# Patient Record
Sex: Female | Born: 1960 | Race: Black or African American | Hispanic: No | Smoking: Never smoker
Health system: Southern US, Community
[De-identification: ages and names within clinical notes are randomized; demographics above are authoritative.]

## PROBLEM LIST (undated history)

## (undated) DIAGNOSIS — M797 Fibromyalgia: Secondary | ICD-10-CM

## (undated) DIAGNOSIS — G40909 Epilepsy, unspecified, not intractable, without status epilepticus: Secondary | ICD-10-CM

## (undated) DIAGNOSIS — E7800 Pure hypercholesterolemia, unspecified: Secondary | ICD-10-CM

## (undated) DIAGNOSIS — J45909 Unspecified asthma, uncomplicated: Secondary | ICD-10-CM

## (undated) DIAGNOSIS — I10 Essential (primary) hypertension: Secondary | ICD-10-CM

## (undated) DIAGNOSIS — I4891 Unspecified atrial fibrillation: Secondary | ICD-10-CM

## (undated) HISTORY — PX: CHOLECYSTECTOMY: SHX55

---

## 2021-02-10 ENCOUNTER — Other Ambulatory Visit: Payer: Self-pay

## 2021-02-10 ENCOUNTER — Emergency Department (HOSPITAL_BASED_OUTPATIENT_CLINIC_OR_DEPARTMENT_OTHER): Payer: Medicare Other

## 2021-02-10 ENCOUNTER — Emergency Department (HOSPITAL_BASED_OUTPATIENT_CLINIC_OR_DEPARTMENT_OTHER)
Admission: EM | Admit: 2021-02-10 | Discharge: 2021-02-11 | Disposition: A | Discharge status: Home / Self Care | Payer: Medicare Other | Attending: Emergency Medicine | Admitting: Emergency Medicine

## 2021-02-10 ENCOUNTER — Encounter (HOSPITAL_BASED_OUTPATIENT_CLINIC_OR_DEPARTMENT_OTHER): Payer: Self-pay | Admitting: *Deleted

## 2021-02-10 DIAGNOSIS — I482 Chronic atrial fibrillation, unspecified: Secondary | ICD-10-CM | POA: Diagnosis not present

## 2021-02-10 DIAGNOSIS — M79671 Pain in right foot: Secondary | ICD-10-CM | POA: Insufficient documentation

## 2021-02-10 DIAGNOSIS — Z7901 Long term (current) use of anticoagulants: Secondary | ICD-10-CM | POA: Insufficient documentation

## 2021-02-10 DIAGNOSIS — I10 Essential (primary) hypertension: Secondary | ICD-10-CM | POA: Diagnosis not present

## 2021-02-10 DIAGNOSIS — R072 Precordial pain: Secondary | ICD-10-CM | POA: Insufficient documentation

## 2021-02-10 DIAGNOSIS — M25571 Pain in right ankle and joints of right foot: Secondary | ICD-10-CM | POA: Insufficient documentation

## 2021-02-10 DIAGNOSIS — Z79899 Other long term (current) drug therapy: Secondary | ICD-10-CM | POA: Diagnosis not present

## 2021-02-10 DIAGNOSIS — J45909 Unspecified asthma, uncomplicated: Secondary | ICD-10-CM | POA: Insufficient documentation

## 2021-02-10 HISTORY — DX: Pure hypercholesterolemia, unspecified: E78.00

## 2021-02-10 HISTORY — DX: Essential (primary) hypertension: I10

## 2021-02-10 HISTORY — DX: Epilepsy, unspecified, not intractable, without status epilepticus: G40.909

## 2021-02-10 HISTORY — DX: Unspecified atrial fibrillation: I48.91

## 2021-02-10 HISTORY — DX: Fibromyalgia: M79.7

## 2021-02-10 HISTORY — DX: Unspecified asthma, uncomplicated: J45.909

## 2021-02-10 NOTE — ED Triage Notes (Signed)
Pt. Said the [place] was not done on Sat.  It was done in Feb. 2022 she was confused on the dates.

## 2021-02-10 NOTE — ED Triage Notes (Signed)
Pt. Reports she has Fibromyalgia and was Diagnosed on Sat.  Pt. Said she has pain from the groin into her R ankle.  Pt. Said the pain was so bad in her R ankle and lower R leg that her chest began to hurt.  Pt. Able to verbalize everything with no shortness of breath and reports the ankle being the main source of pain tonight. We will do EKG in triage due to CP and history of A FIB.

## 2021-02-11 ENCOUNTER — Encounter (HOSPITAL_BASED_OUTPATIENT_CLINIC_OR_DEPARTMENT_OTHER): Payer: Self-pay | Admitting: Emergency Medicine

## 2021-02-11 ENCOUNTER — Emergency Department (HOSPITAL_BASED_OUTPATIENT_CLINIC_OR_DEPARTMENT_OTHER): Payer: Medicare Other

## 2021-02-11 DIAGNOSIS — R072 Precordial pain: Secondary | ICD-10-CM | POA: Diagnosis not present

## 2021-02-11 LAB — BASIC METABOLIC PANEL
Anion gap: 11 (ref 5–15)
BUN: 10 mg/dL (ref 8–23)
CO2: 24 mmol/L (ref 22–32)
Calcium: 8.6 mg/dL — ABNORMAL LOW (ref 8.9–10.3)
Chloride: 94 mmol/L — ABNORMAL LOW (ref 98–111)
Creatinine, Ser: 0.61 mg/dL (ref 0.44–1.00)
GFR, Estimated: >60 mL/min (ref >60)
Glucose, Bld: 131 mg/dL — ABNORMAL HIGH (ref 70–99)
Potassium: 3.3 mmol/L — ABNORMAL LOW (ref 3.5–5.1)
Sodium: 129 mmol/L — ABNORMAL LOW (ref 135–145)

## 2021-02-11 LAB — CBC WITH DIFFERENTIAL/PLATELET
Abs Immature Granulocytes: 0.01 10*3/uL (ref 0.00–0.07)
Basophils Absolute: 0.1 10*3/uL (ref 0.0–0.1)
Basophils Relative: 1 %
Eosinophils Absolute: 0 10*3/uL (ref 0.0–0.5)
Eosinophils Relative: 0 %
HCT: 44.2 % (ref 36.0–46.0)
Hemoglobin: 15.6 g/dL — ABNORMAL HIGH (ref 12.0–15.0)
Immature Granulocytes: 0 %
Lymphocytes Relative: 48 %
Lymphs Abs: 4 10*3/uL (ref 0.7–4.0)
MCH: 30.5 pg (ref 26.0–34.0)
MCHC: 35.3 g/dL (ref 30.0–36.0)
MCV: 86.3 fL (ref 80.0–100.0)
Monocytes Absolute: 0.5 10*3/uL (ref 0.1–1.0)
Monocytes Relative: 5 %
Neutro Abs: 3.9 10*3/uL (ref 1.7–7.7)
Neutrophils Relative %: 46 %
Platelets: 297 10*3/uL (ref 150–400)
RBC: 5.12 MIL/uL — ABNORMAL HIGH (ref 3.87–5.11)
RDW: 12.8 % (ref 11.5–15.5)
Smear Review: NORMAL
WBC Morphology: ABNORMAL
WBC: 8.5 10*3/uL (ref 4.0–10.5)
nRBC: 0 % (ref 0.0–0.2)

## 2021-02-11 LAB — TROPONIN I (HIGH SENSITIVITY)
Troponin I (High Sensitivity): 2 ng/L (ref <18)
Troponin I (High Sensitivity): <2 ng/L (ref <18)

## 2021-02-11 MED ORDER — ALUM & MAG HYDROXIDE-SIMETH 200-200-20 MG/5ML PO SUSP
30.0000 mL | Freq: Once | ORAL | Status: AC
  Administered 2021-02-11: 30 mL via ORAL

## 2021-02-11 MED ORDER — LIDOCAINE 5 % EX PTCH
1.0000 | MEDICATED_PATCH | CUTANEOUS | 0 refills | Status: DC
Start: 2021-02-11 — End: 2023-01-11

## 2021-02-11 MED ORDER — IOHEXOL 350 MG/ML SOLN
100.0000 mL | Freq: Once | INTRAVENOUS | Status: AC | PRN
  Administered 2021-02-11: 100 mL via INTRAVENOUS

## 2021-02-11 MED ORDER — ALUM & MAG HYDROXIDE-SIMETH 200-200-20 MG/5ML PO SUSP
ORAL | Status: AC
  Filled 2021-02-11: qty 30

## 2021-02-11 MED ORDER — LIDOCAINE 5 % EX PTCH
3.0000 | MEDICATED_PATCH | CUTANEOUS | Status: DC
  Administered 2021-02-11: 3 via TRANSDERMAL

## 2021-02-11 MED ORDER — ACETAMINOPHEN 500 MG PO TABS: 1000.0000 mg | ORAL_TABLET | Freq: Once | ORAL | Status: DC

## 2021-02-11 MED ORDER — ACETAMINOPHEN 500 MG PO TABS
ORAL_TABLET | ORAL | Status: AC
  Filled 2021-02-11: qty 2

## 2021-02-11 NOTE — ED Provider Notes (Signed)
[facility]PART[facility] Provider Note   CSN: [id number] Arrival date & time: 02/10/21  2201     History Chief Complaint  Patient presents with  . Ankle Pain  . Chest Pain    Kathleen Moreno is a 60 y.o. female.  The history is provided by the patient.  Ankle Pain Location:  Ankle and foot Injury: no   Ankle location:  R ankle Foot location:  R foot Pain details:    Quality:  Aching   Radiates to:  Does not radiate   Severity:  Moderate   Onset quality:  Gradual   Timing:  Constant   Progression:  Unchanged Chronicity:  Recurrent Relieved by:  Nothing Worsened by:  Nothing Ineffective treatments:  None tried Associated symptoms: no back pain, no decreased ROM, no fatigue, no fever, no muscle weakness, no neck pain, no numbness, no stiffness, no swelling and no tingling   Risk factors: no concern for non-accidental trauma   Chest Pain Pain location:  L chest Pain quality: dull   Pain radiates to:  Does not radiate Pain severity:  Mild Onset quality:  Gradual Timing:  Constant Progression:  Unchanged Chronicity:  New Context: at rest   Relieved by:  Nothing Worsened by:  Nothing Ineffective treatments:  None tried Associated symptoms: no abdominal pain, no altered mental status, no back pain, no cough, no diaphoresis, no fatigue, no fever, no nausea, no orthopnea, no palpitations, no shortness of breath, no vomiting and no weakness   Risk factors: no birth control   Patient with chronic AFIB on Xarelto and fibromyalgia present with right ankle and foot pain for 4 hours. No swelling, no trauma.  No missed doses of anti-coagulation.  Patient also has chest pain for several hours that started at rest.  No DOE, no SOB.  No n/v/d.  No f/c/r.       Past Medical History:  Diagnosis Date  . Asthma   . Atrial fibrillation ([place])   . Fibromyalgia   . Hypercholesteremia   . Hypertension   . Seizure disorder ([place])     There are no problems to display  for this patient.   Past Surgical History:  Procedure Laterality Date  . CESAREAN SECTION     times 3  . CHOLECYSTECTOMY       OB History   No obstetric history on file.     History reviewed. No pertinent family history.     Home Medications Prior to Admission medications   Medication Sig Start Date End Date Taking? Authorizing Provider  albuterol (VENTOLIN HFA) 108 (90 Base) MCG/ACT inhaler INHALE 2 PUFFS BY MOUTH EVERY 6 HOURS AS NEEDED FOR WHEEZING 02/01/20  Yes [provider]  Blood Pressure Monitoring (BLOOD PRESSURE KIT) KIT 1 Device by Misc.(Non-Drug; Combo Route) route 2 (two) times daily as needed (for monitoring of Hypertenison). 03/29/19  Yes [provider]  budesonide-formoterol (SYMBICORT) 160-4.5 MCG/ACT inhaler 2 puffs bid rinsing mouth out after each use 02/01/20  Yes [provider]  Cholecalciferol 25 MCG (1000 UT) tablet Take by mouth.   Yes [provider]  cyanocobalamin 1000 MCG tablet Take by mouth.   Yes [provider]  lidocaine (LIDODERM) 5 % Place 1 patch onto the skin daily. Remove & Discard patch within 12 hours or as directed by MD 02/11/21  Yes Jamelia Varano, MD  LORazepam (ATIVAN) 0.5 MG tablet Take 1 tablet by mouth daily as needed. 10/11/19  Yes [provider]  losartan-hydrochlorothiazide (  HYZAAR) 50-12.5 MG tablet  12/06/14  Yes [provider]  oxcarbazepine (TRILEPTAL) 600 MG tablet Take 1 tablet by mouth 2 (two) times daily. 08/06/20  Yes [provider]  potassium chloride (MICRO-K) 10 MEQ CR capsule TAKE 1 CAPSULE BY MOUTH TWICE DAILY WITH FOOD 06/27/20  Yes [provider]  rivaroxaban (XARELTO) 20 MG TABS tablet Take 20 mg by mouth daily with supper.   Yes [provider]  simvastatin (ZOCOR) 40 MG tablet Take 1 tablet by mouth at bedtime. 08/29/20  Yes [provider]    Allergies    Codeine  Review of Systems   Review of Systems   Constitutional: Negative for diaphoresis, fatigue and fever.  HENT: Negative for congestion.   Eyes: Negative for visual disturbance.  Respiratory: Negative for cough and shortness of breath.   Cardiovascular: Positive for chest pain. Negative for palpitations and orthopnea.  Gastrointestinal: Negative for abdominal pain, nausea and vomiting.  Genitourinary: Negative for difficulty urinating.  Musculoskeletal: Negative for back pain, neck pain and stiffness.  Skin: Negative for rash.  Neurological: Negative for weakness.  Psychiatric/Behavioral: Negative for agitation.  All other systems reviewed and are negative.   Physical Exam Updated Vital Signs BP 120/85 (BP Location: Right Arm)   Pulse 72   Temp 98.2 F (36.8 C) (Oral)   Resp 19   Ht 4' 11"  (1.499 m)   Wt 76.7 kg   SpO2 98%   BMI 34.13 kg/m   Physical Exam Vitals and nursing note reviewed.  Constitutional:      General: She is not in acute distress.    Appearance: Normal appearance.  HENT:     Head: Normocephalic and atraumatic.     Nose: Nose normal.  Eyes:     Extraocular Movements: Extraocular movements intact.     Conjunctiva/sclera: Conjunctivae normal.     Pupils: Pupils are equal, round, and reactive to light.  Cardiovascular:     Rate and Rhythm: Normal rate and regular rhythm.     Pulses: Normal pulses.     Heart sounds: Normal heart sounds.  Pulmonary:     Effort: Pulmonary effort is normal.     Breath sounds: Normal breath sounds.  Abdominal:     General: Abdomen is flat. Bowel sounds are normal.     Palpations: Abdomen is soft.     Tenderness: There is no abdominal tenderness. There is no guarding.  Musculoskeletal:        General: Normal range of motion.     Cervical back: Normal range of motion and neck supple.     Right upper leg: Normal.     Right knee: Normal.     Right lower leg: Normal.     Right ankle: Normal.     Right Achilles Tendon: Normal.     Right foot: Normal.  Skin:     General: Skin is warm and dry.     Capillary Refill: Capillary refill takes less than 2 seconds.  Neurological:     General: No focal deficit present.     Mental Status: She is alert and oriented to person, place, and time.     Deep Tendon Reflexes: Reflexes normal.  Psychiatric:        Mood and Affect: Mood normal.        Behavior: Behavior normal.     ED Results / Procedures / Treatments   Labs (all labs ordered are listed, but only abnormal results are displayed) Results for orders placed  or performed during the hospital encounter of 02/10/21  CBC with Differential/Platelet  Result Value Ref Range   WBC 8.5 4.0 - 10.5 K/uL   RBC 5.12 (H) 3.87 - 5.11 MIL/uL   Hemoglobin 15.6 (H) 12.0 - 15.0 g/dL   HCT 44.2 36.0 - 46.0 %   MCV 86.3 80.0 - 100.0 fL   MCH 30.5 26.0 - 34.0 pg   MCHC 35.3 30.0 - 36.0 g/dL   RDW 12.8 11.5 - 15.5 %   Platelets 297 150 - 400 K/uL   nRBC 0.0 0.0 - 0.2 %   Neutrophils Relative % 46 %   Neutro Abs 3.9 1.7 - 7.7 K/uL   Lymphocytes Relative 48 %   Lymphs Abs 4.0 0.7 - 4.0 K/uL   Monocytes Relative 5 %   Monocytes Absolute 0.5 0.1 - 1.0 K/uL   Eosinophils Relative 0 %   Eosinophils Absolute 0.0 0.0 - 0.5 K/uL   Basophils Relative 1 %   Basophils Absolute 0.1 0.0 - 0.1 K/uL   WBC Morphology Abnormal lymphocytes present    RBC Morphology MORPHOLOGY UNREMARKABLE    Smear Review Normal platelet morphology    Immature Granulocytes 0 %   Abs Immature Granulocytes 0.01 0.00 - 0.07 K/uL  Basic metabolic panel  Result Value Ref Range   Sodium 129 (L) 135 - 145 mmol/L   Potassium 3.3 (L) 3.5 - 5.1 mmol/L   Chloride 94 (L) 98 - 111 mmol/L   CO2 24 22 - 32 mmol/L   Glucose, Bld 131 (H) 70 - 99 mg/dL   BUN 10 8 - 23 mg/dL   Creatinine, Ser 0.61 0.44 - 1.00 mg/dL   Calcium 8.6 (L) 8.9 - 10.3 mg/dL   GFR, Estimated >60 >60 mL/min   Anion gap 11 5 - 15  Troponin I (High Sensitivity)  Result Value Ref Range   Troponin I (High Sensitivity) <2 <18 ng/L   Troponin I (High Sensitivity)  Result Value Ref Range   Troponin I (High Sensitivity) 2 <18 ng/L   CT Angio Chest PE W and/or Wo Contrast  Result Date: 02/11/2021 CLINICAL DATA:  Chest pain and shortness of breath EXAM: CT ANGIOGRAPHY CHEST WITH CONTRAST TECHNIQUE: Multidetector CT imaging of the chest was performed using the standard protocol during bolus administration of intravenous contrast. Multiplanar CT image reconstructions and MIPs were obtained to evaluate the vascular anatomy. CONTRAST:  163m OMNIPAQUE IOHEXOL 350 MG/ML SOLN COMPARISON:  None. FINDINGS: Cardiovascular: Contrast injection is sufficient to demonstrate satisfactory opacification of the pulmonary arteries to the segmental level. There is no pulmonary embolus or evidence of right heart strain. The size of the main pulmonary artery is normal. Heart size is normal, with no pericardial effusion. The course and caliber of the aorta are normal. There is no atherosclerotic calcification. Opacification decreased due to pulmonary arterial phase contrast bolus timing. Mediastinum/Nodes: No mediastinal, hilar or axillary lymphadenopathy. Normal visualized thyroid. Thoracic esophageal course is normal. Lungs/Pleura: Airways are patent. No pleural effusion, lobar consolidation, pneumothorax or pulmonary infarction. Upper Abdomen: Contrast bolus timing is not optimized for evaluation of the abdominal organs. The visualized portions of the organs of the upper abdomen are normal. Musculoskeletal: No chest wall abnormality. No bony spinal canal stenosis. Review of the MIP images confirms the above findings. IMPRESSION: No pulmonary embolus or other acute thoracic abnormality. Electronically Signed   By: KUlyses JarredM.D.   On: 02/11/2021 01:42   DG Chest Portable 1 View  Result Date: 02/10/2021 CLINICAL DATA:  Chest pain. EXAM: PORTABLE CHEST 1 VIEW COMPARISON:  07/24/2017 FINDINGS: Upper normal heart size. The cardiomediastinal contours are  normal. The lungs are clear. Pulmonary vasculature is normal. No consolidation, pleural effusion, or pneumothorax. No acute osseous abnormalities are seen. IMPRESSION: Upper normal heart size without acute pulmonary process. Electronically Signed   By: Keith Rake M.D.   On: 02/10/2021 23:51    EKG EKG Interpretation  Date/Time:  Monday February 10 2021 22:15:38 EDT Ventricular Rate:  89 PR Interval:    QRS Duration: 68 QT Interval:  338 QTC Calculation: 411 R Axis:   -21 Text Interpretation: Atrial fibrillation Abnormal ECG No old tracing to compare Confirmed by Aletta Edouard [id number]) on 02/10/2021 10:23:25 PM   Radiology CT Angio Chest PE W and/or Wo Contrast  Result Date: 02/11/2021 CLINICAL DATA:  Chest pain and shortness of breath EXAM: CT ANGIOGRAPHY CHEST WITH CONTRAST TECHNIQUE: Multidetector CT imaging of the chest was performed using the standard protocol during bolus administration of intravenous contrast. Multiplanar CT image reconstructions and MIPs were obtained to evaluate the vascular anatomy. CONTRAST:  13m OMNIPAQUE IOHEXOL 350 MG/ML SOLN COMPARISON:  None. FINDINGS: Cardiovascular: Contrast injection is sufficient to demonstrate satisfactory opacification of the pulmonary arteries to the segmental level. There is no pulmonary embolus or evidence of right heart strain. The size of the main pulmonary artery is normal. Heart size is normal, with no pericardial effusion. The course and caliber of the aorta are normal. There is no atherosclerotic calcification. Opacification decreased due to pulmonary arterial phase contrast bolus timing. Mediastinum/Nodes: No mediastinal, hilar or axillary lymphadenopathy. Normal visualized thyroid. Thoracic esophageal course is normal. Lungs/Pleura: Airways are patent. No pleural effusion, lobar consolidation, pneumothorax or pulmonary infarction. Upper Abdomen: Contrast bolus timing is not optimized for evaluation of the abdominal organs. The  visualized portions of the organs of the upper abdomen are normal. Musculoskeletal: No chest wall abnormality. No bony spinal canal stenosis. Review of the MIP images confirms the above findings. IMPRESSION: No pulmonary embolus or other acute thoracic abnormality. Electronically Signed   By: KUlyses JarredM.D.   On: 02/11/2021 01:42   DG Chest Portable 1 View  Result Date: 02/10/2021 CLINICAL DATA:  Chest pain. EXAM: PORTABLE CHEST 1 VIEW COMPARISON:  07/24/2017 FINDINGS: Upper normal heart size. The cardiomediastinal contours are normal. The lungs are clear. Pulmonary vasculature is normal. No consolidation, pleural effusion, or pneumothorax. No acute osseous abnormalities are seen. IMPRESSION: Upper normal heart size without acute pulmonary process. Electronically Signed   By: MKeith RakeM.D.   On: 02/10/2021 23:51    Procedures Procedures   Medications Ordered in ED Medications  lidocaine (LIDODERM) 5 % 3 patch (3 patches Transdermal Patch Applied 02/11/21 0135)  acetaminophen (TYLENOL) tablet 1,000 mg (has no administration in time range)  alum & mag hydroxide-simeth (MAALOX/MYLANTA) 200-200-20 MG/5ML suspension 30 mL (has no administration in time range)  iohexol (OMNIPAQUE) 350 MG/ML injection 100 mL (100 mLs Intravenous Contrast Given 02/11/21 0125)    ED Course  I have reviewed the triage vital signs and the nursing notes.  Pertinent labs & imaging results that were available during my care of the patient were reviewed by me and considered in my medical decision making (see chart for details).    Ruled out for MI and PE in the ED.  Heart score is 3 low risk for MACE.  Foot is normal as is ankle.  No history of trauma.  No signs of trauma on exam.  No bruising.  No wounds, no erythema, no warmth.  I do not believe Xrays are indicated at this time.  Will treat symptomatically.  Follow up with your doctor for ongoing care.  Strict return precautions given.    Kathleen Moreno was  evaluated in Emergency Department on 02/11/2021 for the symptoms described in the history of present illness. She was evaluated in the context of the global COVID-19 pandemic, which necessitated consideration that the patient might be at risk for infection with the SARS-CoV-2 virus that causes COVID-19. Institutional protocols and algorithms that pertain to the evaluation of patients at risk for COVID-19 are in a state of rapid change based on information released by regulatory bodies including the CDC and federal and state organizations. These policies and algorithms were followed during the patient's care in the ED.  Final Clinical Impression(s) / ED Diagnoses Final diagnoses:  Right foot pain  Precordial pain    Return for intractable cough, coughing up blood, fevers >100.4 unrelieved by medication, shortness of breath, intractable vomiting, chest pain, shortness of breath, weakness, numbness, changes in speech, facial asymmetry, abdominal pain, passing out, Inability to tolerate liquids or food, cough, altered mental status or any concerns. No signs of systemic illness or infection. The patient is nontoxic-appearing on exam and vital signs are within normal limits.  I have reviewed the triage vital signs and the nursing notes. Pertinent labs & imaging results that were available during my care of the patient were reviewed by me and considered in my medical decision making (see chart for details). After history, exam, and medical workup I feel the patient has been appropriately medically screened and is safe for discharge home. Pertinent diagnoses were discussed with the patient. Patient was given return precautions. Rx / DC Orders ED Discharge Orders         Ordered    lidocaine (LIDODERM) 5 %  Every 24 hours        02/11/21 0148           Jance Siek, MD 02/11/21 [id number]

## 2021-12-31 ENCOUNTER — Other Ambulatory Visit: Payer: Self-pay

## 2021-12-31 ENCOUNTER — Encounter (HOSPITAL_BASED_OUTPATIENT_CLINIC_OR_DEPARTMENT_OTHER): Payer: Self-pay

## 2021-12-31 ENCOUNTER — Emergency Department (HOSPITAL_BASED_OUTPATIENT_CLINIC_OR_DEPARTMENT_OTHER): Payer: Medicare Other

## 2021-12-31 ENCOUNTER — Emergency Department (HOSPITAL_BASED_OUTPATIENT_CLINIC_OR_DEPARTMENT_OTHER)
Admission: EM | Admit: 2021-12-31 | Discharge: 2022-01-01 | Disposition: A | Discharge status: Home / Self Care | Payer: Medicare Other | Attending: Emergency Medicine | Admitting: Emergency Medicine

## 2021-12-31 DIAGNOSIS — M79604 Pain in right leg: Secondary | ICD-10-CM | POA: Diagnosis present

## 2021-12-31 LAB — CBC WITH DIFFERENTIAL/PLATELET
Abs Immature Granulocytes: 0.01 10*3/uL (ref 0.00–0.07)
Basophils Absolute: 0 10*3/uL (ref 0.0–0.1)
Basophils Relative: 1 %
Eosinophils Absolute: 0 10*3/uL (ref 0.0–0.5)
Eosinophils Relative: 0 %
HCT: 41.5 % (ref 36.0–46.0)
Hemoglobin: 14.5 g/dL (ref 12.0–15.0)
Immature Granulocytes: 0 %
Lymphocytes Relative: 34 %
Lymphs Abs: 1.4 10*3/uL (ref 0.7–4.0)
MCH: 31.2 pg (ref 26.0–34.0)
MCHC: 34.9 g/dL (ref 30.0–36.0)
MCV: 89.2 fL (ref 80.0–100.0)
Monocytes Absolute: 0.6 10*3/uL (ref 0.1–1.0)
Monocytes Relative: 15 %
Neutro Abs: 2 10*3/uL (ref 1.7–7.7)
Neutrophils Relative %: 50 %
Platelets: 302 10*3/uL (ref 150–400)
RBC: 4.65 MIL/uL (ref 3.87–5.11)
RDW: 12.6 % (ref 11.5–15.5)
WBC: 4 10*3/uL (ref 4.0–10.5)
nRBC: 0 % (ref 0.0–0.2)

## 2021-12-31 LAB — BASIC METABOLIC PANEL
Anion gap: 8 (ref 5–15)
BUN: 9 mg/dL (ref 8–23)
CO2: 25 mmol/L (ref 22–32)
Calcium: 8.6 mg/dL — ABNORMAL LOW (ref 8.9–10.3)
Chloride: 97 mmol/L — ABNORMAL LOW (ref 98–111)
Creatinine, Ser: 0.73 mg/dL (ref 0.44–1.00)
GFR, Estimated: >60 mL/min (ref >60)
Glucose, Bld: 100 mg/dL — ABNORMAL HIGH (ref 70–99)
Potassium: 3.7 mmol/L (ref 3.5–5.1)
Sodium: 130 mmol/L — ABNORMAL LOW (ref 135–145)

## 2021-12-31 LAB — TROPONIN I (HIGH SENSITIVITY): Troponin I (High Sensitivity): 3 ng/L (ref <18)

## 2021-12-31 MED ORDER — LIDOCAINE 5 % EX PTCH: 1.0000 | MEDICATED_PATCH | CUTANEOUS | 0 refills | Status: AC

## 2021-12-31 MED ORDER — LIDOCAINE 5 % EX PTCH
1.0000 | MEDICATED_PATCH | CUTANEOUS | Status: DC
  Administered 2021-12-31: 1 via TRANSDERMAL
  Filled 2021-12-31: qty 1

## 2021-12-31 MED ORDER — ACETAMINOPHEN 325 MG PO TABS
650.0000 mg | ORAL_TABLET | Freq: Once | ORAL | Status: AC
Start: 2021-12-31 — End: 2021-12-31
  Administered 2021-12-31: 650 mg via ORAL
  Filled 2021-12-31: qty 2

## 2021-12-31 NOTE — ED Notes (Signed)
Pt c/o left sided chest pain with nausea and shortness of breath, NAD noted. EKG obtained. Chest pain triage orders initiated.

## 2021-12-31 NOTE — ED Notes (Signed)
Pt returned from radiology.

## 2021-12-31 NOTE — ED Provider Notes (Signed)
[facility] Provider Note   CSN: [id number] Arrival date & time: 12/31/21  2118     History  Chief Complaint  Patient presents with   Leg Pain    Kathleen Moreno is a 61 y.o. female.  The history is provided by the patient.  Leg Pain Location:  Leg Time since incident:  4 days Injury: no   Leg location:  R leg Pain details:    Quality:  Aching (burning)   Severity:  Moderate   Onset quality:  Sudden   Duration:  4 days   Timing:  Constant   Progression:  Unchanged Chronicity:  New Dislocation: no   Foreign body present:  No foreign bodies Prior injury to area:  No Relieved by:  Nothing Worsened by:  Nothing Ineffective treatments:  None tried Associated symptoms: no back pain, no fever, no itching, no neck pain, no numbness and no swelling   Associated symptoms comment:  Also dull Left lower chest pain for 4 days constant.  Patient is constantly in rate controlled Afib and is on xarelto, no missed doses.   Risk factors: no concern for non-accidental trauma   Patient with AFIB on xarelto and fibromyalgia prestns with 4 days of right whole leg pain and L lower chest pain.  No radiation.  No DOE, no SOB.  No back pain.  No n/v/d.      Home Medications Prior to Admission medications   Medication Sig Start Date End Date Taking? Authorizing Provider  albuterol (VENTOLIN HFA) 108 (90 Base) MCG/ACT inhaler INHALE 2 PUFFS BY MOUTH EVERY 6 HOURS AS NEEDED FOR WHEEZING 02/01/20   [provider]  Blood Pressure Monitoring (BLOOD PRESSURE KIT) KIT 1 Device by Misc.(Non-Drug; Combo Route) route 2 (two) times daily as needed (for monitoring of Hypertenison). 03/29/19   [provider]  budesonide-formoterol (SYMBICORT) 160-4.5 MCG/ACT inhaler 2 puffs bid rinsing mouth out after each use 02/01/20   [provider]  Cholecalciferol 25 MCG (1000 UT) tablet Take by mouth.    [provider]  cyanocobalamin 1000 MCG tablet  Take by mouth.    [provider]  lidocaine (LIDODERM) 5 % Place 1 patch onto the skin daily. Remove & Discard patch within 12 hours or as directed by MD 02/11/21   Randal Buba, Sajan Cheatwood, MD  LORazepam (ATIVAN) 0.5 MG tablet Take 1 tablet by mouth daily as needed. 10/11/19   [provider]  losartan-hydrochlorothiazide Konrad Penta) 50-12.5 MG tablet  12/06/14   [provider]  oxcarbazepine (TRILEPTAL) 600 MG tablet Take 1 tablet by mouth 2 (two) times daily. 08/06/20   [provider]  potassium chloride (MICRO-K) 10 MEQ CR capsule TAKE 1 CAPSULE BY MOUTH TWICE DAILY WITH FOOD 06/27/20   [provider]  rivaroxaban (XARELTO) 20 MG TABS tablet Take 20 mg by mouth daily with supper.    [provider]  simvastatin (ZOCOR) 40 MG tablet Take 1 tablet by mouth at bedtime. 08/29/20   [provider]      Allergies    Codeine    Review of Systems   Review of Systems  Constitutional:  Negative for fever.  HENT:  Negative for congestion.   Eyes:  Negative for photophobia.  Respiratory:  Negative for shortness of breath.   Cardiovascular:  Positive for chest pain. Negative for palpitations and leg swelling.  Gastrointestinal:  Negative for abdominal pain.  Genitourinary:  Negative for difficulty urinating.  Musculoskeletal:  Negative for back pain and  neck pain.  Skin:  Negative for itching.  Neurological:  Negative for facial asymmetry.  Psychiatric/Behavioral:  Negative for agitation.   All other systems reviewed and are negative.  Physical Exam Updated Vital Signs BP 136/85 (BP Location: Left Arm)    Pulse 74    Temp 98.7 F (37.1 C) (Oral)    Resp 18    Ht 4' 11"  (1.499 m)    Wt 77.1 kg    SpO2 100%    BMI 34.34 kg/m  Physical Exam Vitals and nursing note reviewed.  Constitutional:      General: She is not in acute distress.    Appearance: Normal appearance.  HENT:     Head: Normocephalic and atraumatic.     Nose: Nose normal.   Eyes:     Conjunctiva/sclera: Conjunctivae normal.     Pupils: Pupils are equal, round, and reactive to light.  Cardiovascular:     Rate and Rhythm: Normal rate and regular rhythm.     Pulses: Normal pulses.     Heart sounds: Normal heart sounds.  Pulmonary:     Effort: Pulmonary effort is normal.     Breath sounds: Normal breath sounds.  Abdominal:     General: Bowel sounds are normal.     Palpations: Abdomen is soft.     Tenderness: There is no abdominal tenderness. There is no guarding.  Musculoskeletal:        General: No swelling, tenderness, deformity or signs of injury. Normal range of motion.     Cervical back: Normal range of motion and neck supple.     Right hip: Normal.     Right upper leg: Normal.     Right knee: Normal.     Instability Tests: Anterior drawer test negative. Posterior drawer test negative. Anterior Lachman test negative. Medial McMurray test negative and lateral McMurray test negative.     Right lower leg: Normal.     Right ankle: Normal.     Right Achilles Tendon: Normal.     Right foot: Normal. Normal range of motion and normal capillary refill. No tenderness or crepitus. Normal pulse.  Skin:    General: Skin is warm and dry.     Capillary Refill: Capillary refill takes less than 2 seconds.     Findings: No rash.  Neurological:     General: No focal deficit present.     Mental Status: She is alert and oriented to person, place, and time.     Deep Tendon Reflexes: Reflexes normal.  Psychiatric:        Mood and Affect: Mood normal.        Behavior: Behavior normal.    ED Results / Procedures / Treatments   Labs (all labs ordered are listed, but only abnormal results are displayed) Labs Reviewed  BASIC METABOLIC PANEL - Abnormal; Notable for the following components:      Result Value   Sodium 130 (*)    Chloride 97 (*)    Glucose, Bld 100 (*)    Calcium 8.6 (*)    All other components within normal limits  CBC WITH DIFFERENTIAL/PLATELET   TROPONIN I (HIGH SENSITIVITY)    EKG None  Radiology DG Chest 2 View  Result Date: 12/31/2021 CLINICAL DATA:  Chest pain and swelling in a 61 year old female. EXAM: CHEST - 2 VIEW COMPARISON:  February 10, 2021. FINDINGS: EKG leads project over the chest. Cardiomediastinal contours and hilar structures with cardiac enlargement and fullness of central pulmonary vasculature. No  lobar consolidation. No pneumothorax. No sign of pleural effusion. On limited assessment there is no acute skeletal process. IMPRESSION: Cardiomegaly with central pulmonary vascular fullness. No signs of edema or effusion. Electronically Signed   By: Zetta Bills M.D.   On: 12/31/2021 22:53   US Venous Img Lower Unilateral Right  Result Date: 12/31/2021 CLINICAL DATA:  Right leg swelling EXAM: RIGHT LOWER EXTREMITY VENOUS DOPPLER ULTRASOUND TECHNIQUE: Gray-scale sonography with compression, as well as color and duplex ultrasound, were performed to evaluate the deep venous system(s) from the level of the common femoral vein through the popliteal and proximal calf veins. COMPARISON:  None. FINDINGS: VENOUS Normal compressibility of the common femoral, superficial femoral, and popliteal veins, as well as the visualized calf veins. Visualized portions of profunda femoral vein and great saphenous vein unremarkable. No filling defects to suggest DVT on grayscale or color Doppler imaging. Doppler waveforms show normal direction of venous flow, normal respiratory plasticity and response to augmentation. Limited views of the contralateral common femoral vein are unremarkable. OTHER None. Limitations: none IMPRESSION: Negative. Electronically Signed   By: Julian Hy M.D.   On: 12/31/2021 23:10    Procedures Procedures    Medications Ordered in ED Medications  lidocaine (LIDODERM) 5 % 1 patch (1 patch Transdermal Patch Applied 12/31/21 2319)  acetaminophen (TYLENOL) tablet 650 mg (650 mg Oral Given 12/31/21 2223)    ED  Course/ Medical Decision Making/ A&P                           Medical Decision Making Chest pain and leg pain for 4 days constant.  No DOE no SOB.  No swelling no rash   Amount and/or Complexity of Data Reviewed External Data Reviewed: notes.    Details: I have reviewed neurology notes at [facility] from November Labs: ordered.    Details: Normal troponin at 3, normal CBC no elevated white count Radiology: ordered.    Details: I have reviewed CXR and there is no PNA.  I reviewed doppler and there is no DVT by me and this agrees with radiology ECG/medicine tests: ordered and independent interpretation performed.    Details: AFIB by me on EKG, I also reviewed previous EKG as well  Risk Prescription drug management. Decision regarding hospitalization. Risk Details: Differential was DVT or fibromyalgia or muscle pain.  I considered hospitalization in this patient, but well appearing with normal vitals and exam and normal labs and imaging.  Stable for discharge with close follow up.      Final Clinical Impression(s) / ED Diagnoses Final diagnoses:  None   Return for intractable cough, coughing up blood, fevers > 100.4 unrelieved by medication, shortness of breath, intractable vomiting, chest pain, shortness of breath, weakness, numbness, changes in speech, facial asymmetry, abdominal pain, passing out, Inability to tolerate liquids or food, cough, altered mental status or any concerns. No signs of systemic illness or infection. The patient is nontoxic-appearing on exam and vital signs are within normal limits.  I have reviewed the triage vital signs and the nursing notes. Pertinent labs & imaging results that were available during my care of the patient were reviewed by me and considered in my medical decision making (see chart for details). After history, exam, and medical workup I feel the patient has been appropriately medically screened and is safe for discharge home. Pertinent diagnoses  were discussed with the patient. Patient was given return precautions. Rx / DC Orders ED Discharge  Orders     None         Leilanee Righetti, MD 12/31/21 2337

## 2021-12-31 NOTE — ED Triage Notes (Signed)
Pt c/o pain/swelling to right LE/burning to right foot-denies injury-NAD-steady gait

## 2022-01-29 IMAGING — DX DG CHEST 1V PORT
1 series · 1 of 1 positions shown · non-contrast
Comparison: 07/24/2017

CLINICAL DATA: Chest pain.

EXAM:
PORTABLE CHEST 1 VIEW

[chest ap]
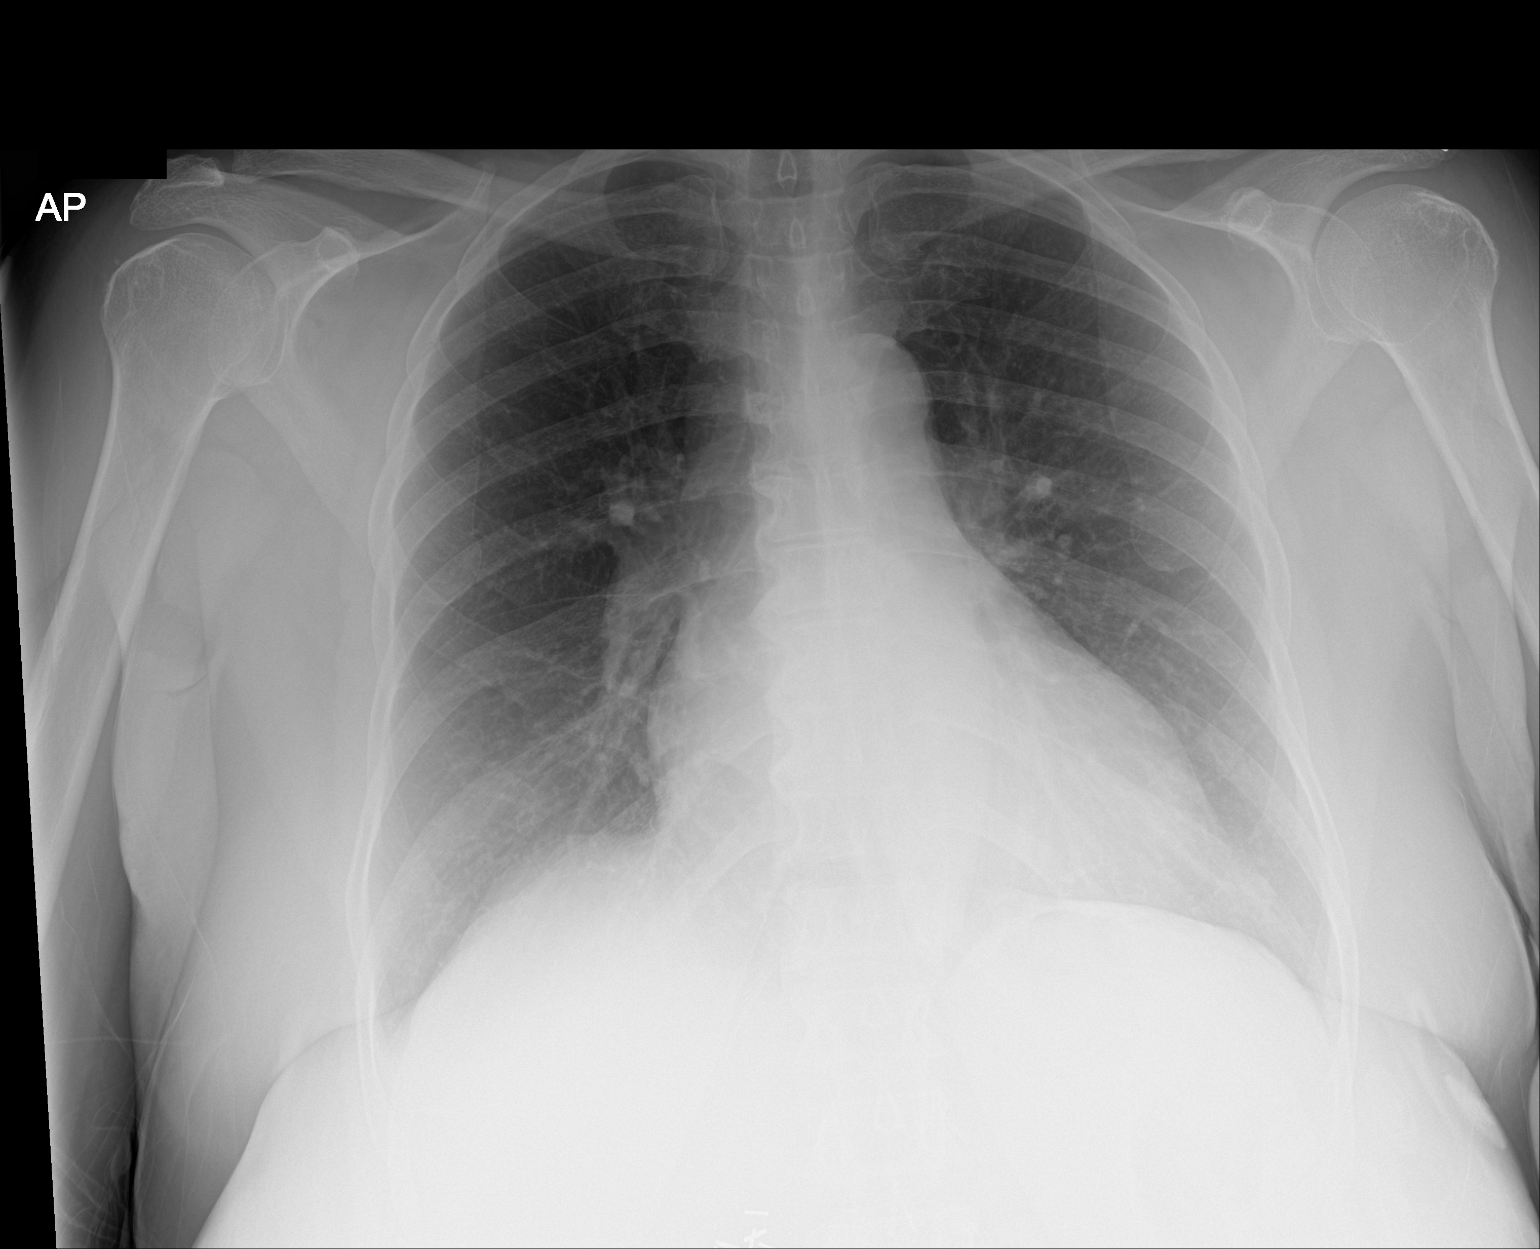

[1 of 1 positions shown; findings below may reference images not displayed]

FINDINGS: Upper normal heart size. The cardiomediastinal contours are normal.
The lungs are clear. Pulmonary vasculature is normal. No
consolidation, pleural effusion, or pneumothorax. No acute osseous
abnormalities are seen.
IMPRESSION: Upper normal heart size without acute pulmonary process.

## 2022-10-17 ENCOUNTER — Emergency Department (HOSPITAL_BASED_OUTPATIENT_CLINIC_OR_DEPARTMENT_OTHER): Payer: Medicare Other

## 2022-10-17 ENCOUNTER — Emergency Department (HOSPITAL_BASED_OUTPATIENT_CLINIC_OR_DEPARTMENT_OTHER)
Admission: EM | Admit: 2022-10-17 | Discharge: 2022-10-17 | Disposition: A | Discharge status: Home / Self Care | Payer: Medicare Other | Attending: Emergency Medicine | Admitting: Emergency Medicine

## 2022-10-17 ENCOUNTER — Encounter (HOSPITAL_BASED_OUTPATIENT_CLINIC_OR_DEPARTMENT_OTHER): Payer: Self-pay | Admitting: Emergency Medicine

## 2022-10-17 ENCOUNTER — Encounter (HOSPITAL_COMMUNITY): Payer: Self-pay

## 2022-10-17 DIAGNOSIS — R079 Chest pain, unspecified: Secondary | ICD-10-CM | POA: Insufficient documentation

## 2022-10-17 DIAGNOSIS — Z79899 Other long term (current) drug therapy: Secondary | ICD-10-CM | POA: Insufficient documentation

## 2022-10-17 DIAGNOSIS — R0602 Shortness of breath: Secondary | ICD-10-CM | POA: Insufficient documentation

## 2022-10-17 DIAGNOSIS — I10 Essential (primary) hypertension: Secondary | ICD-10-CM | POA: Insufficient documentation

## 2022-10-17 DIAGNOSIS — Z7901 Long term (current) use of anticoagulants: Secondary | ICD-10-CM | POA: Insufficient documentation

## 2022-10-17 LAB — BASIC METABOLIC PANEL
Anion gap: 6 (ref 5–15)
BUN: 8 mg/dL (ref 8–23)
CO2: 27 mmol/L (ref 22–32)
Calcium: 8.9 mg/dL (ref 8.9–10.3)
Chloride: 104 mmol/L (ref 98–111)
Creatinine, Ser: 0.67 mg/dL (ref 0.44–1.00)
GFR, Estimated: >60 mL/min (ref >60)
Glucose, Bld: 104 mg/dL — ABNORMAL HIGH (ref 70–99)
Potassium: 3.8 mmol/L (ref 3.5–5.1)
Sodium: 137 mmol/L (ref 135–145)

## 2022-10-17 LAB — CBC
HCT: 43.1 % (ref 36.0–46.0)
Hemoglobin: 14.9 g/dL (ref 12.0–15.0)
MCH: 31.2 pg (ref 26.0–34.0)
MCHC: 34.6 g/dL (ref 30.0–36.0)
MCV: 90.2 fL (ref 80.0–100.0)
Platelets: 292 10*3/uL (ref 150–400)
RBC: 4.78 MIL/uL (ref 3.87–5.11)
RDW: 12.6 % (ref 11.5–15.5)
WBC: 3.5 10*3/uL — ABNORMAL LOW (ref 4.0–10.5)
nRBC: 0 % (ref 0.0–0.2)

## 2022-10-17 LAB — TROPONIN I (HIGH SENSITIVITY)
Troponin I (High Sensitivity): 3 ng/L (ref <18)
Troponin I (High Sensitivity): 2 ng/L (ref <18)

## 2022-10-17 NOTE — ED Triage Notes (Signed)
Pt reports her a fib has been acting up since last night. Pt reports shortness of breath and chest pain.

## 2022-10-17 NOTE — ED Provider Notes (Signed)
[facility] EMER[facility] DEPARTMENT Provider Note   CSN: [id number] Arrival date & time: 10/17/22  0850     History  Chief Complaint  Patient presents with   Chest Pain   Shortness of Breath    Kathleen Moreno is a 61 y.o. female.  Patient is a 61 year old female with past medical history of seizures, A-fib, hypertension, and hyperlipidemia presenting for complaints of chest pain and shortness of breath.  Patient states her symptoms began last night, located on the left chest border, without radiation, minimal discomfort, and constant.  Patient denies any fevers, chills, coughing.  Denies any history of PE or DVT.  Denies orthopnea or lower extremity swelling.  History of atrial fibrillation.  On Xarelto.  Cardiologist retired.  The history is provided by the patient. No language interpreter was used.  Chest Pain Associated symptoms: shortness of breath   Associated symptoms: no abdominal pain, no back pain, no cough, no fever, no palpitations and no vomiting   Shortness of Breath Associated symptoms: chest pain   Associated symptoms: no abdominal pain, no cough, no ear pain, no fever, no rash, no sore throat and no vomiting        Home Medications Prior to Admission medications   Medication Sig Start Date End Date Taking? Authorizing Provider  albuterol (VENTOLIN HFA) 108 (90 Base) MCG/ACT inhaler INHALE 2 PUFFS BY MOUTH EVERY 6 HOURS AS NEEDED FOR WHEEZING 02/01/20   [provider]  Blood Pressure Monitoring (BLOOD PRESSURE KIT) KIT 1 Device by Misc.(Non-Drug; Combo Route) route 2 (two) times daily as needed (for monitoring of Hypertenison). 03/29/19   [provider]  budesonide-formoterol (SYMBICORT) 160-4.5 MCG/ACT inhaler 2 puffs bid rinsing mouth out after each use 02/01/20   [provider]  Cholecalciferol 25 MCG (1000 UT) tablet Take by mouth.    [provider]  cyanocobalamin 1000 MCG tablet Take by mouth.    [provider]  lidocaine (LIDODERM) 5 % Place 1 patch onto the skin daily. Remove & Discard patch within 12 hours or as directed by MD 02/11/21   Palumbo, April, MD  lidocaine (LIDODERM) 5 % Place 1 patch onto the skin daily. Remove & Discard patch within 12 hours or as directed by MD 12/31/21   Randal Buba, April, MD  LORazepam (ATIVAN) 0.5 MG tablet Take 1 tablet by mouth daily as needed. 10/11/19   [provider]  losartan-hydrochlorothiazide Konrad Penta) 50-12.5 MG tablet  12/06/14   [provider]  oxcarbazepine (TRILEPTAL) 600 MG tablet Take 1 tablet by mouth 2 (two) times daily. 08/06/20   [provider]  potassium chloride (MICRO-K) 10 MEQ CR capsule TAKE 1 CAPSULE BY MOUTH TWICE DAILY WITH FOOD 06/27/20   [provider]  rivaroxaban (XARELTO) 20 MG TABS tablet Take 20 mg by mouth daily with supper.    [provider]  simvastatin (ZOCOR) 40 MG tablet Take 1 tablet by mouth at bedtime. 08/29/20   [provider]      Allergies    Codeine    Review of Systems   Review of Systems  Constitutional:  Negative for chills and fever.  HENT:  Negative for ear pain and sore throat.   Eyes:  Negative for pain and visual disturbance.  Respiratory:  Positive for shortness of breath. Negative for cough.   Cardiovascular:  Positive for chest pain. Negative for palpitations.  Gastrointestinal:  Negative for abdominal pain and vomiting.  Genitourinary:  Negative for dysuria and hematuria.  Musculoskeletal:  Negative for arthralgias and back pain.  Skin:  Negative for color change and rash.  Neurological:  Negative for seizures and syncope.  All other systems reviewed and are negative.   Physical Exam Updated Vital Signs BP (!) 144/80   Pulse 66   Temp 98.5 F (36.9 C) (Oral)   Resp 20   Ht _0  (1.499 m)   Wt 77.1 kg   SpO2 98%   BMI 34.34 kg/m  Physical Exam Vitals and nursing note reviewed.  Constitutional:      General: She is not  in acute distress.    Appearance: She is well-developed.  HENT:     Head: Normocephalic and atraumatic.  Eyes:     Conjunctiva/sclera: Conjunctivae normal.  Cardiovascular:     Rate and Rhythm: Normal rate and regular rhythm.     Heart sounds: No murmur heard. Pulmonary:     Effort: Pulmonary effort is normal. No respiratory distress.     Breath sounds: Normal breath sounds.  Abdominal:     Palpations: Abdomen is soft.     Tenderness: There is no abdominal tenderness.  Musculoskeletal:        General: No swelling.     Cervical back: Neck supple.     Right lower leg: No edema.     Left lower leg: No edema.  Skin:    General: Skin is warm and dry.     Capillary Refill: Capillary refill takes less than 2 seconds.  Neurological:     Mental Status: She is alert and oriented to person, place, and time.     GCS: GCS eye subscore is 4. GCS verbal subscore is 5. GCS motor subscore is 6.  Psychiatric:        Mood and Affect: Mood normal.     ED Results / Procedures / Treatments   Labs (all labs ordered are listed, but only abnormal results are displayed) Labs Reviewed  BASIC METABOLIC PANEL - Abnormal; Notable for the following components:      Result Value   Glucose, Bld 104 (*)    All other components within normal limits  CBC - Abnormal; Notable for the following components:   WBC 3.5 (*)    All other components within normal limits  TROPONIN I (HIGH SENSITIVITY)  TROPONIN I (HIGH SENSITIVITY)    EKG EKG Interpretation  Date/Time:  Saturday October 17 2022 08:57:22 EST Ventricular Rate:  84 PR Interval:    QRS Duration: 80 QT Interval:  377 QTC Calculation: 443 R Axis:   -19 Text Interpretation: Atrial fibrillation Ventricular premature complex Borderline left axis deviation Confirmed by Campbell Stall ([id number]) on 07/22/453 11:45:54 AM  Radiology DG Chest 2 View  Result Date: 10/17/2022 CLINICAL DATA:  61 year old female with history of shortness of breath. EXAM:  CHEST - 2 VIEW COMPARISON:  Chest x-ray 12/31/2021. FINDINGS: Lung volumes are normal. No consolidative airspace disease. No pleural effusions. No pneumothorax. No pulmonary nodule or mass noted. Pulmonary vasculature and the cardiomediastinal silhouette are within normal limits. IMPRESSION: No radiographic evidence of acute cardiopulmonary disease. Electronically Signed   By: Vinnie Langton M.D.   On: 10/17/2022 09:22    Procedures Procedures    Medications Ordered in ED Medications - No data to display  ED Course/ Medical Decision Making/ A&P                           Medical Decision Making Amount and/or Complexity of Data  Reviewed Labs: ordered. Radiology: ordered.   51:66 AM 61 year old female with past medical history of seizures, A-fib, hypertension, and hyperlipidemia presenting for complaints of chest pain and shortness of breath.  Patient is well-appearing, alert and oriented x3, no acute distress, afebrile, similar to signs.  Physical exam demonstrates atrial fibrillation with a stable rate of 70 bpm.  Patient has equal bilateral breath sounds with no adventitious lung sounds.  No hypoxia.  No respiratory distress.  Chest pain is not reproducible.  Chest wall rashes.  ECG as interpreted by myself demonstrates no ST segment elevation or depression.  Patient's A-fib with stable rate.  No concerning T wave inversions.    The patient's chest pain is not suggestive of pulmonary embolus, cardiac ischemia, aortic dissection, pericarditis, myocarditis, pulmonary embolism, pneumothorax, pneumonia, Zoster, or esophageal perforation, or other serious etiology.  Historically not abrupt in onset, tearing or ripping, pulses symmetric. EKG nonspecific for ischemia/infarction. No dysrhythmias, brugada, WPW, prolonged QT noted. [CXR reviewed and WNL] [Troponin negative x2. CXR reviewed. Labs without demonstration of acute pathology unless otherwise noted above.   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  Recommend for close follow-up in A-fib clinic.  Contact information provided.  States chest pain is minimal at this time.  Stable for discharge.  Detailed discussions were had with the patient regarding current findings, and need for close f/u with PCP or on call doctor. The patient has been instructed to return immediately if the symptoms worsen in any way for re-evaluation. Patient verbalized understanding and is in agreement with current care plan. All questions answered prior to discharge.          Final Clinical Impression(s) / ED Diagnoses Final diagnoses:  Chest pain, unspecified type    Rx / DC Orders ED Discharge Orders     None         Lianne Cure, DO 95/74/73 1146

## 2022-10-17 NOTE — ED Notes (Signed)
Pt took home morning medications

## 2022-10-30 ENCOUNTER — Ambulatory Visit (HOSPITAL_COMMUNITY): Payer: Medicare Other | Admitting: Physician Assistant

## 2022-11-01 ENCOUNTER — Emergency Department (HOSPITAL_BASED_OUTPATIENT_CLINIC_OR_DEPARTMENT_OTHER)
Admission: EM | Admit: 2022-11-01 | Discharge: 2022-11-02 | Disposition: A | Discharge status: Home / Self Care | Payer: Medicare Other | Attending: Emergency Medicine | Admitting: Emergency Medicine

## 2022-11-01 ENCOUNTER — Emergency Department (HOSPITAL_BASED_OUTPATIENT_CLINIC_OR_DEPARTMENT_OTHER): Payer: Medicare Other

## 2022-11-01 ENCOUNTER — Other Ambulatory Visit: Payer: Self-pay

## 2022-11-01 ENCOUNTER — Encounter (HOSPITAL_BASED_OUTPATIENT_CLINIC_OR_DEPARTMENT_OTHER): Payer: Self-pay | Admitting: Emergency Medicine

## 2022-11-01 DIAGNOSIS — R079 Chest pain, unspecified: Secondary | ICD-10-CM | POA: Diagnosis present

## 2022-11-01 DIAGNOSIS — Z1152 Encounter for screening for COVID-19: Secondary | ICD-10-CM | POA: Insufficient documentation

## 2022-11-01 DIAGNOSIS — R0602 Shortness of breath: Secondary | ICD-10-CM | POA: Diagnosis not present

## 2022-11-01 DIAGNOSIS — Z79899 Other long term (current) drug therapy: Secondary | ICD-10-CM | POA: Insufficient documentation

## 2022-11-01 DIAGNOSIS — I10 Essential (primary) hypertension: Secondary | ICD-10-CM | POA: Insufficient documentation

## 2022-11-01 DIAGNOSIS — Z7901 Long term (current) use of anticoagulants: Secondary | ICD-10-CM | POA: Insufficient documentation

## 2022-11-01 DIAGNOSIS — J45909 Unspecified asthma, uncomplicated: Secondary | ICD-10-CM | POA: Insufficient documentation

## 2022-11-01 DIAGNOSIS — Z7951 Long term (current) use of inhaled steroids: Secondary | ICD-10-CM | POA: Insufficient documentation

## 2022-11-01 DIAGNOSIS — T5891XA Toxic effect of carbon monoxide from unspecified source, accidental (unintentional), initial encounter: Secondary | ICD-10-CM | POA: Diagnosis not present

## 2022-11-01 DIAGNOSIS — Z7729 Contact with and (suspected ) exposure to other hazardous substances: Secondary | ICD-10-CM

## 2022-11-01 LAB — CARBOXYHEMOGLOBIN - COOX: Carboxyhemoglobin: 2.7 % — ABNORMAL HIGH (ref 0.5–1.5)

## 2022-11-01 LAB — CBC
HCT: 45.3 % (ref 36.0–46.0)
Hemoglobin: 15.9 g/dL — ABNORMAL HIGH (ref 12.0–15.0)
MCH: 31.1 pg (ref 26.0–34.0)
MCHC: 35.1 g/dL (ref 30.0–36.0)
MCV: 88.6 fL (ref 80.0–100.0)
Platelets: 346 10*3/uL (ref 150–400)
RBC: 5.11 MIL/uL (ref 3.87–5.11)
RDW: 12.5 % (ref 11.5–15.5)
WBC: 7.1 10*3/uL (ref 4.0–10.5)
nRBC: 0 % (ref 0.0–0.2)

## 2022-11-01 LAB — TROPONIN I (HIGH SENSITIVITY)
Troponin I (High Sensitivity): 5 ng/L (ref <18)
Troponin I (High Sensitivity): 6 ng/L (ref <18)

## 2022-11-01 LAB — BASIC METABOLIC PANEL
Anion gap: 8 (ref 5–15)
BUN: 8 mg/dL (ref 8–23)
CO2: 27 mmol/L (ref 22–32)
Calcium: 9.1 mg/dL (ref 8.9–10.3)
Chloride: 98 mmol/L (ref 98–111)
Creatinine, Ser: 0.72 mg/dL (ref 0.44–1.00)
GFR, Estimated: >60 mL/min (ref >60)
Glucose, Bld: 88 mg/dL (ref 70–99)
Potassium: 3.1 mmol/L — ABNORMAL LOW (ref 3.5–5.1)
Sodium: 133 mmol/L — ABNORMAL LOW (ref 135–145)

## 2022-11-01 LAB — D-DIMER, QUANTITATIVE: D-Dimer, Quant: <0.27 ug/mL-FEU (ref 0.00–0.50)

## 2022-11-01 LAB — RESP PANEL BY RT-PCR (RSV, FLU A&B, COVID)  RVPGX2
Influenza A by PCR: NEGATIVE
Influenza B by PCR: NEGATIVE
Resp Syncytial Virus by PCR: NEGATIVE
SARS Coronavirus 2 by RT PCR: NEGATIVE

## 2022-11-01 MED ORDER — ACETAMINOPHEN 500 MG PO TABS
1000.0000 mg | ORAL_TABLET | ORAL | Status: AC
  Administered 2022-11-01: 1000 mg via ORAL
  Filled 2022-11-01: qty 2

## 2022-11-01 MED ORDER — ASPIRIN 81 MG PO CHEW
324.0000 mg | CHEWABLE_TABLET | ORAL | Status: AC
  Administered 2022-11-01: 324 mg via ORAL
  Filled 2022-11-01: qty 4

## 2022-11-01 MED ORDER — POTASSIUM CHLORIDE CRYS ER 20 MEQ PO TBCR: 40.0000 meq | EXTENDED_RELEASE_TABLET | Freq: Once | ORAL | Status: DC

## 2022-11-01 NOTE — ED Provider Notes (Signed)
[place] [facility] EMERGENCY DEPARTMENT Provider Note   CSN: [id number] Arrival date & time: 11/01/22  1919     History {Add pertinent medical, surgical, social history, OB history to HPI:1} Chief Complaint  Patient presents with   Chest Pain    Kathleen Moreno is a 61 y.o. female.  Granddaughter had headache and chest pain last week when comign to her house Went hom - mold - nose and throat burning, chest pain, palpiatiosn, l chest tightness not pleuritic or exerti, sob, would occaionsally get these symptoms but then No leg pain Cyanide. Not burning anything in the house. Does not smoke cigarettes. No hx of heart attaks takes, seizures, A-fib, hypertension, and hyperlipidemia presenting for complaints of chest pain and shortness of breath on xarelto. No hx fo dvt/pe. No immed fam hx of mi. No cancer and not on hormones. Also with headaches. No fever or cough. Unsure if she has a carbon monoxide detector in her house.   Was last in her house at 3pm till [place] Medications Prior to Admission medications   Medication Sig Start Date End Date Taking? Authorizing Provider  albuterol (VENTOLIN HFA) 108 (90 Base) MCG/ACT inhaler INHALE 2 PUFFS BY MOUTH EVERY 6 HOURS AS NEEDED FOR WHEEZING 02/01/20   [provider]  Blood Pressure Monitoring (BLOOD PRESSURE KIT) KIT 1 Device by Misc.(Non-Drug; Combo Route) route 2 (two) times daily as needed (for monitoring of Hypertenison). 03/29/19   [provider]  budesonide-formoterol (SYMBICORT) 160-4.5 MCG/ACT inhaler 2 puffs bid rinsing mouth out after each use 02/01/20   [provider]  Cholecalciferol 25 MCG (1000 UT) tablet Take by mouth.    [provider]  cyanocobalamin 1000 MCG tablet Take by mouth.    [provider]  lidocaine (LIDODERM) 5 % Place 1 patch onto the skin daily. Remove & Discard patch within 12 hours or as directed by MD 02/11/21   Palumbo, April, MD  lidocaine  (LIDODERM) 5 % Place 1 patch onto the skin daily. Remove & Discard patch within 12 hours or as directed by MD 12/31/21   Randal Buba, April, MD  LORazepam (ATIVAN) 0.5 MG tablet Take 1 tablet by mouth daily as needed. 10/11/19   [provider]  losartan-hydrochlorothiazide Konrad Penta) 50-12.5 MG tablet  12/06/14   [provider]  oxcarbazepine (TRILEPTAL) 600 MG tablet Take 1 tablet by mouth 2 (two) times daily. 08/06/20   [provider]  potassium chloride (MICRO-K) 10 MEQ CR capsule TAKE 1 CAPSULE BY MOUTH TWICE DAILY WITH FOOD 06/27/20   [provider]  rivaroxaban (XARELTO) 20 MG TABS tablet Take 20 mg by mouth daily with supper.    [provider]  simvastatin (ZOCOR) 40 MG tablet Take 1 tablet by mouth at bedtime. 08/29/20   [provider]      Allergies    Codeine    Review of Systems   Review of Systems  Physical Exam Updated Vital Signs BP 129/62   Pulse 75   Temp 97.7 F (36.5 C) (Oral)   Resp (!) 23   Ht _0  (1.499 m)   Wt 75.3 kg   SpO2 99%   BMI 33.53 kg/m  Physical Exam  ED Results / Procedures / Treatments   Labs (all labs ordered are listed, but only abnormal results are displayed) Labs Reviewed  BASIC METABOLIC PANEL - Abnormal; Notable for the following components:      Result Value  Sodium 133 (*)    Potassium 3.1 (*)    All other components within normal limits  CBC - Abnormal; Notable for the following components:   Hemoglobin 15.9 (*)    All other components within normal limits  TROPONIN I (HIGH SENSITIVITY)  TROPONIN I (HIGH SENSITIVITY)    EKG EKG Interpretation  Date/Time:  Sunday November 01 2022 19:27:16 EST Ventricular Rate:  92 PR Interval:    QRS Duration: 80 QT Interval:  366 QTC Calculation: 453 R Axis:   -22 Text Interpretation: Atrial fibrillation Ventricular premature complex Borderline left axis deviation Borderline T abnormalities, inferior leads Confirmed by Margaretmary Eddy [phone]) on 11/01/2022 7:36:45 PM  Radiology DG Chest 2 View  Result Date: 11/01/2022 CLINICAL DATA:  cp EXAM: CHEST - 2 VIEW COMPARISON:  Chest x-ray 10/17/2022 FINDINGS: The heart and mediastinal contours are within normal limits. No focal consolidation. No pulmonary edema. No pleural effusion. No pneumothorax. No acute osseous abnormality. Surgical clips overlie the upper abdomen. IMPRESSION: No active cardiopulmonary disease. Electronically Signed   By: Iven Finn M.D.   On: 11/01/2022 19:55    Procedures Procedures  {Document cardiac monitor, telemetry assessment procedure when appropriate:1}  Medications Ordered in ED Medications - No data to display  ED Course/ Medical Decision Making/ A&P                           Medical Decision Making Amount and/or Complexity of Data Reviewed Labs: ordered. Radiology: ordered.  Risk OTC drugs. Prescription drug management.   ***  {Document critical care time when appropriate:1} {Document review of labs and clinical decision tools ie heart score, Chads2Vasc2 etc:1}  {Document your independent review of radiology images, and any outside records:1} {Document your discussion with family members, caretakers, and with consultants:1} {Document social determinants of health affecting pt's care:1} {Document your decision making why or why not admission, treatments were needed:1} Final Clinical Impression(s) / ED Diagnoses Final diagnoses:  None    Rx / DC Orders ED Discharge Orders     None

## 2022-11-01 NOTE — ED Triage Notes (Signed)
Pt reports CP, heart racing since 1700; sts she was in a house with mold and she thinks this set it off

## 2022-11-01 NOTE — ED Notes (Signed)
Pt given ginger ale; provider reports it is okay to give clear liquids until carboxy hgb level results. Pt was requesting food earlier.

## 2022-11-01 NOTE — Discharge Instructions (Addendum)
You were seen in the emergency department for your chest pain.  Your labs sent which showed that your carbon monoxide level was slightly elevated.  It may be that the symptoms that you have at your apartment are from carbon oxide exposure so please have a carbon oxide detector placed in your home and do not go home until you do so.  Please tell family members not to go back in the house until the carbon monoxide detectors in place.  Follow-up with your primary doctor in 2 to 3 days.  Return to the emergency department if you experience any of the following: Worsening chest pain, difficulty breathing, or any other concerning symptoms.

## 2022-11-24 ENCOUNTER — Ambulatory Visit (HOSPITAL_COMMUNITY): Payer: Medicare Other | Admitting: Physician Assistant

## 2022-11-30 ENCOUNTER — Encounter (HOSPITAL_COMMUNITY): Payer: Self-pay | Admitting: Physician Assistant

## 2022-11-30 ENCOUNTER — Ambulatory Visit (HOSPITAL_COMMUNITY)
Admission: RE | Admit: 2022-11-30 | Discharge: 2022-11-30 | Disposition: A | Discharge status: Home / Self Care | Payer: Medicare Other | Source: Ambulatory Visit | Attending: Physician Assistant | Admitting: Physician Assistant

## 2022-11-30 VITALS — BP 136/100 | HR 89 | Ht 59.0 in | Wt 165.0 lb

## 2022-11-30 DIAGNOSIS — I10 Essential (primary) hypertension: Secondary | ICD-10-CM | POA: Insufficient documentation

## 2022-11-30 DIAGNOSIS — Z79899 Other long term (current) drug therapy: Secondary | ICD-10-CM | POA: Insufficient documentation

## 2022-11-30 DIAGNOSIS — Z7901 Long term (current) use of anticoagulants: Secondary | ICD-10-CM | POA: Insufficient documentation

## 2022-11-30 DIAGNOSIS — G4733 Obstructive sleep apnea (adult) (pediatric): Secondary | ICD-10-CM | POA: Insufficient documentation

## 2022-11-30 DIAGNOSIS — I4891 Unspecified atrial fibrillation: Secondary | ICD-10-CM | POA: Diagnosis not present

## 2022-11-30 DIAGNOSIS — I4811 Longstanding persistent atrial fibrillation: Secondary | ICD-10-CM | POA: Insufficient documentation

## 2022-11-30 NOTE — Progress Notes (Signed)
Primary Care Physician: [facility] Primary Cardiologist: none Primary Electrophysiologist: none Referring Physician: [facility]   Kathleen Moreno is a 62 y.o. female with a history of HLD, OSA, HTN, seizure disorder, atrial fibrillation who presents for consultation in the [facility].  The patient was initially diagnosed with atrial fibrillation remotely and was followed at [facility] cardiology but her physician retired and she has not seen cardiology since. Per patient report, her care team discussed DCCV with her but she was to afraid of the procedure. ECGs in Epic show afib since 01/2021. Patient is on Xarelto for a CHADS2VASC score of 2. She was seen at the ED 10/17/22 for chest pain and SOB. Workup at that time was unremarkable, referred to AF clinic. Patient remains in afib today. She does feel fatigued often. She is not on CPAP therapy for her OSA.  Today, she denies symptoms of palpitations, chest pain, shortness of breath, orthopnea, PND, lower extremity edema, dizziness, presyncope, syncope, bleeding, or neurologic sequela. The patient is tolerating medications without difficulties and is otherwise without complaint today.    Atrial Fibrillation Risk Factors:  she does have symptoms or diagnosis of sleep apnea. she is not compliant with CPAP therapy. she does not have a history of rheumatic fever.   she has a BMI of Body mass index is 33.33 kg/m.Marland Kitchen Filed Weights   11/30/22 1456  Weight: 74.8 kg    No family history on file.   Atrial Fibrillation Management history:  Previous antiarrhythmic drugs: none Previous cardioversions: none Previous ablations: none CHADS2VASC score: 2 Anticoagulation history: Xarelto   Past Medical History:  Diagnosis Date   Asthma    Atrial fibrillation (HCC)    Fibromyalgia    Hypercholesteremia    Hypertension    Seizure disorder (HCC)    Past Surgical History:  Procedure Laterality Date    CESAREAN SECTION     times 3   CHOLECYSTECTOMY      Current Outpatient Medications  Medication Sig Dispense Refill   albuterol (VENTOLIN HFA) 108 (90 Base) MCG/ACT inhaler INHALE 2 PUFFS BY MOUTH EVERY 6 HOURS AS NEEDED FOR WHEEZING     Blood Pressure Monitoring (BLOOD PRESSURE KIT) KIT 1 Device by Misc.(Non-Drug; Combo Route) route 2 (two) times daily as needed (for monitoring of Hypertenison).     budesonide-formoterol (SYMBICORT) 160-4.5 MCG/ACT inhaler 2 puffs bid rinsing mouth out after each use     Cholecalciferol 25 MCG (1000 UT) tablet Take by mouth.     cyanocobalamin 1000 MCG tablet Take by mouth.     lidocaine (LIDODERM) 5 % Place 1 patch onto the skin daily. Remove & Discard patch within 12 hours or as directed by MD 30 patch 0   lidocaine (LIDODERM) 5 % Place 1 patch onto the skin daily. Remove & Discard patch within 12 hours or as directed by MD 30 patch 0   LINZESS 145 MCG CAPS capsule Take 145 mcg by mouth daily.     LORazepam (ATIVAN) 0.5 MG tablet Take 1 tablet by mouth daily as needed.     losartan-hydrochlorothiazide (HYZAAR) 50-12.5 MG tablet      oxcarbazepine (TRILEPTAL) 600 MG tablet Take 1 tablet by mouth 2 (two) times daily.     potassium chloride (MICRO-K) 10 MEQ CR capsule TAKE 1 CAPSULE BY MOUTH TWICE DAILY WITH FOOD     rivaroxaban (XARELTO) 20 MG TABS tablet Take 20 mg by mouth daily with supper.  simvastatin (ZOCOR) 40 MG tablet Take 1 tablet by mouth at bedtime.     No current facility-administered medications for this encounter.    Allergies  Allergen Reactions   Codeine     Social History   Socioeconomic History   Marital status: Widowed    Spouse name: Not on file   Number of children: Not on file   Years of education: Not on file   Highest education level: Not on file  Occupational History   Not on file  Tobacco Use   Smoking status: Never   Smokeless tobacco: Never   Tobacco comments:    Never smoke 11/30/22  Vaping Use    Vaping Use: Never used  Substance and Sexual Activity   Alcohol use: Never   Drug use: Never   Sexual activity: Not on file  Other Topics Concern   Not on file  Social History Narrative   Not on file   Social Determinants of Health   Financial Resource Strain: Not on file  Food Insecurity: Not on file  Transportation Needs: Not on file  Physical Activity: Not on file  Stress: Not on file  Social Connections: Not on file  Intimate Partner Violence: Not on file     ROS- All systems are reviewed and negative except as per the HPI above.  Physical Exam: Vitals:   11/30/22 1456  BP: (!) 136/100  Pulse: 89  Weight: 74.8 kg  Height: 4\' 11"  (1.499 m)    GEN- The patient is a well appearing female, alert and oriented x 3 today.   Head- normocephalic, atraumatic Eyes-  Sclera clear, conjunctiva pink Ears- hearing intact Oropharynx- clear Neck- supple  Lungs- Clear to ausculation bilaterally, normal work of breathing Heart- irregular rate and rhythm, no murmurs, rubs or gallops  GI- soft, NT, ND, + BS Extremities- no clubbing, cyanosis, or edema MS- no significant deformity or atrophy Skin- no rash or lesion Psych- euthymic mood, full affect Neuro- strength and sensation are intact  Wt Readings from Last 3 Encounters:  11/30/22 74.8 kg  11/01/22 75.3 kg  10/17/22 77.1 kg    EKG today demonstrates  Afib Vent. rate 89 BPM PR interval * ms QRS duration 74 ms QT/QTcB 370/450 ms   Epic records are reviewed at length today  CHA2DS2-VASc Score = 2  The patient's score is based upon: CHF History: 0 HTN History: 1 Diabetes History: 0 Stroke History: 0 Vascular Disease History: 0 Age Score: 0 Gender Score: 1       ASSESSMENT AND PLAN: 1. Longstanding Persistent Atrial Fibrillation (ICD10:  I48.11) The patient's CHA2DS2-VASc score is 2, indicating a 2.2% annual risk of stroke.   In afib today. Suspect her afib in longstanding, no ECG in Epic in SR. There is a  report from [place] in 2018 of an ECG in SR. She is rate controlled on no AV nodal agents.  We discussed rhythm control options today. She will likely need DCCV + AAD to maintain SR. She would like to take time to consider her options.  Check echocardiogram.  Continue Xarelto 20 mg daily  2. Obstructive sleep apnea The importance of adequate treatment of sleep apnea was discussed today in order to improve our ability to maintain sinus rhythm long term. Patient does not want to reconsider CPAP.  3. HTN Diastolic elevated today. Patient to continue to monitor and follow up with PCP.   Follow up in the AF clinic in 4-6 weeks to revisit rhythm control.  [facility] [address] [place], [place] [address] [phone] 11/30/2022 3:13 PM

## 2022-12-19 IMAGING — DX DG CHEST 2V
2 series · 2 of 2 positions shown · non-contrast
Comparison: February 10, 2021.

CLINICAL DATA: Chest pain and swelling in a 60-year-old female.

EXAM:
CHEST - 2 VIEW

[chest pa]
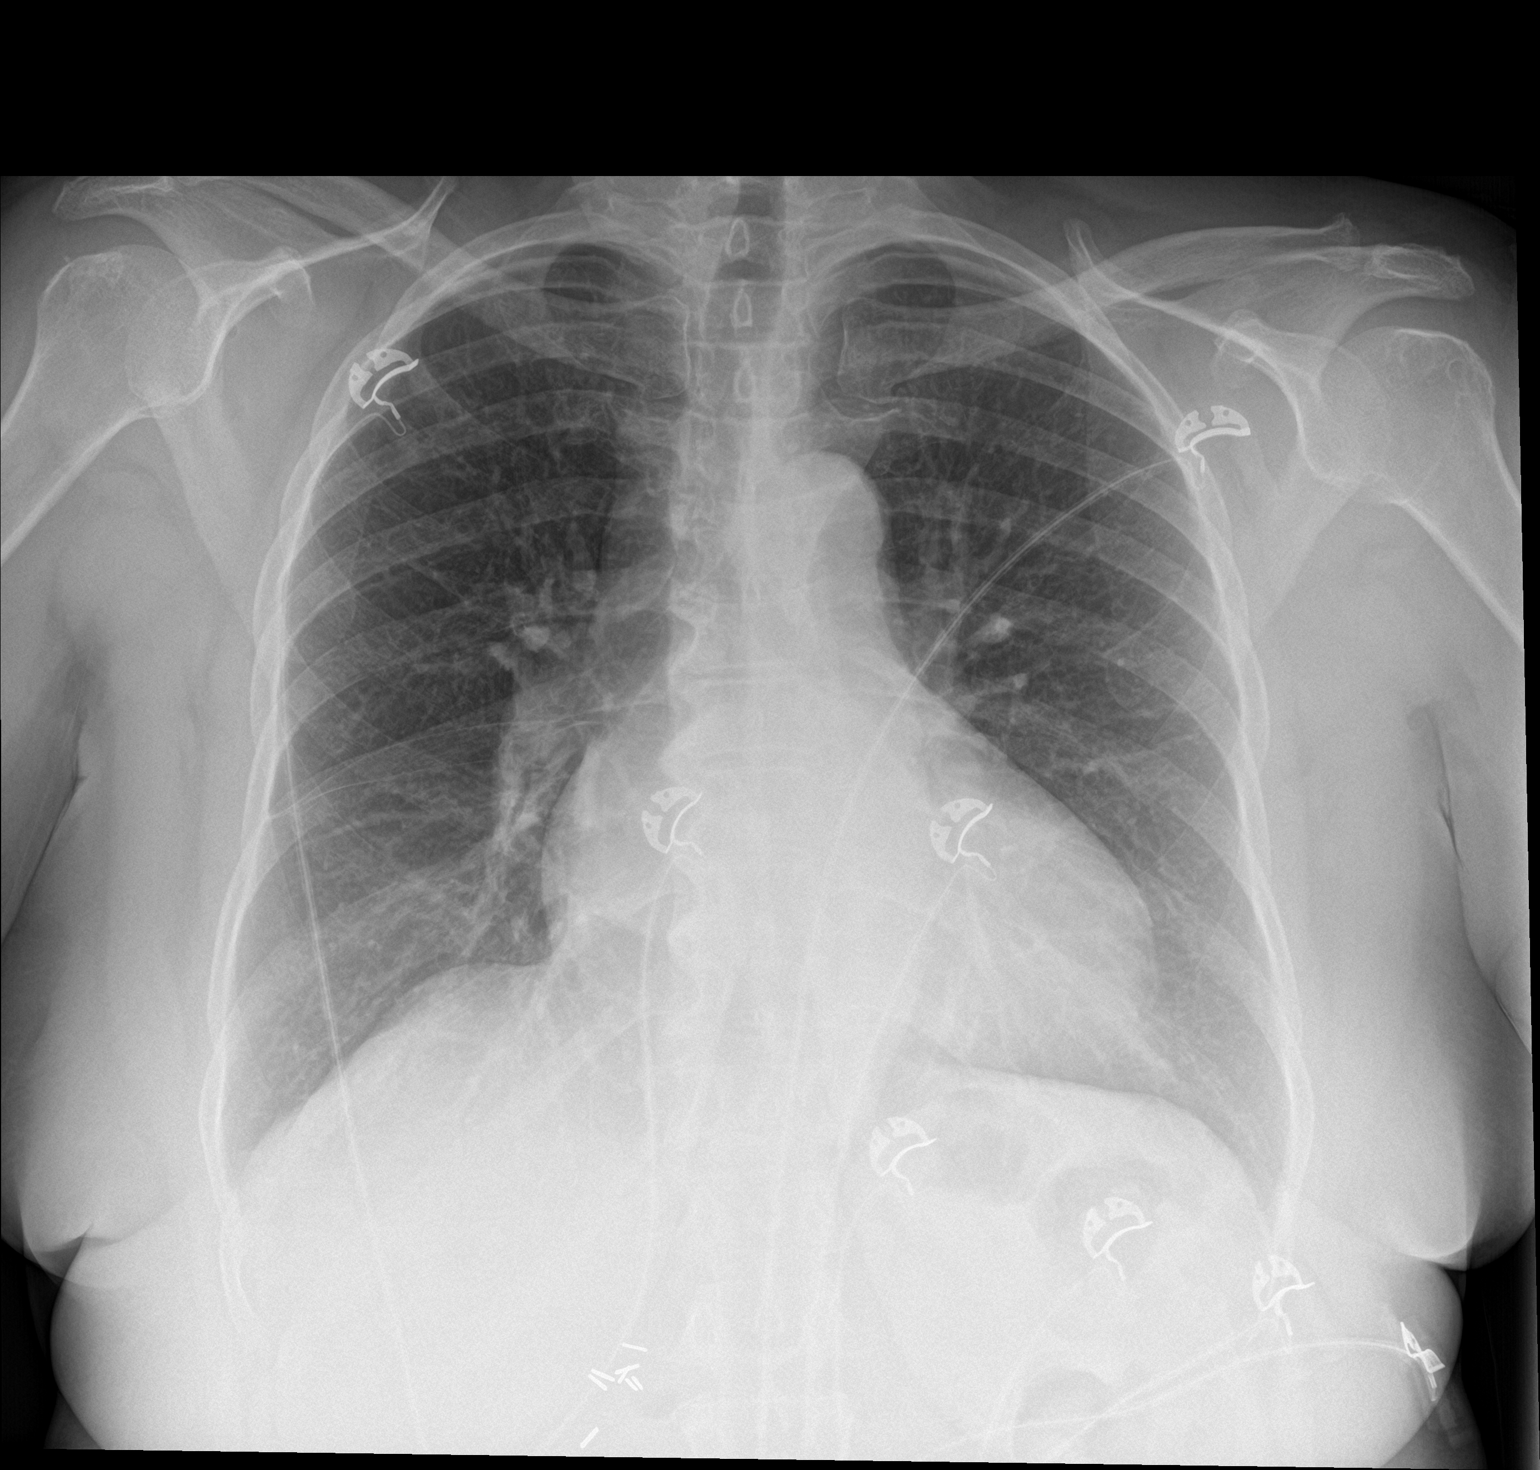

[chest lat]
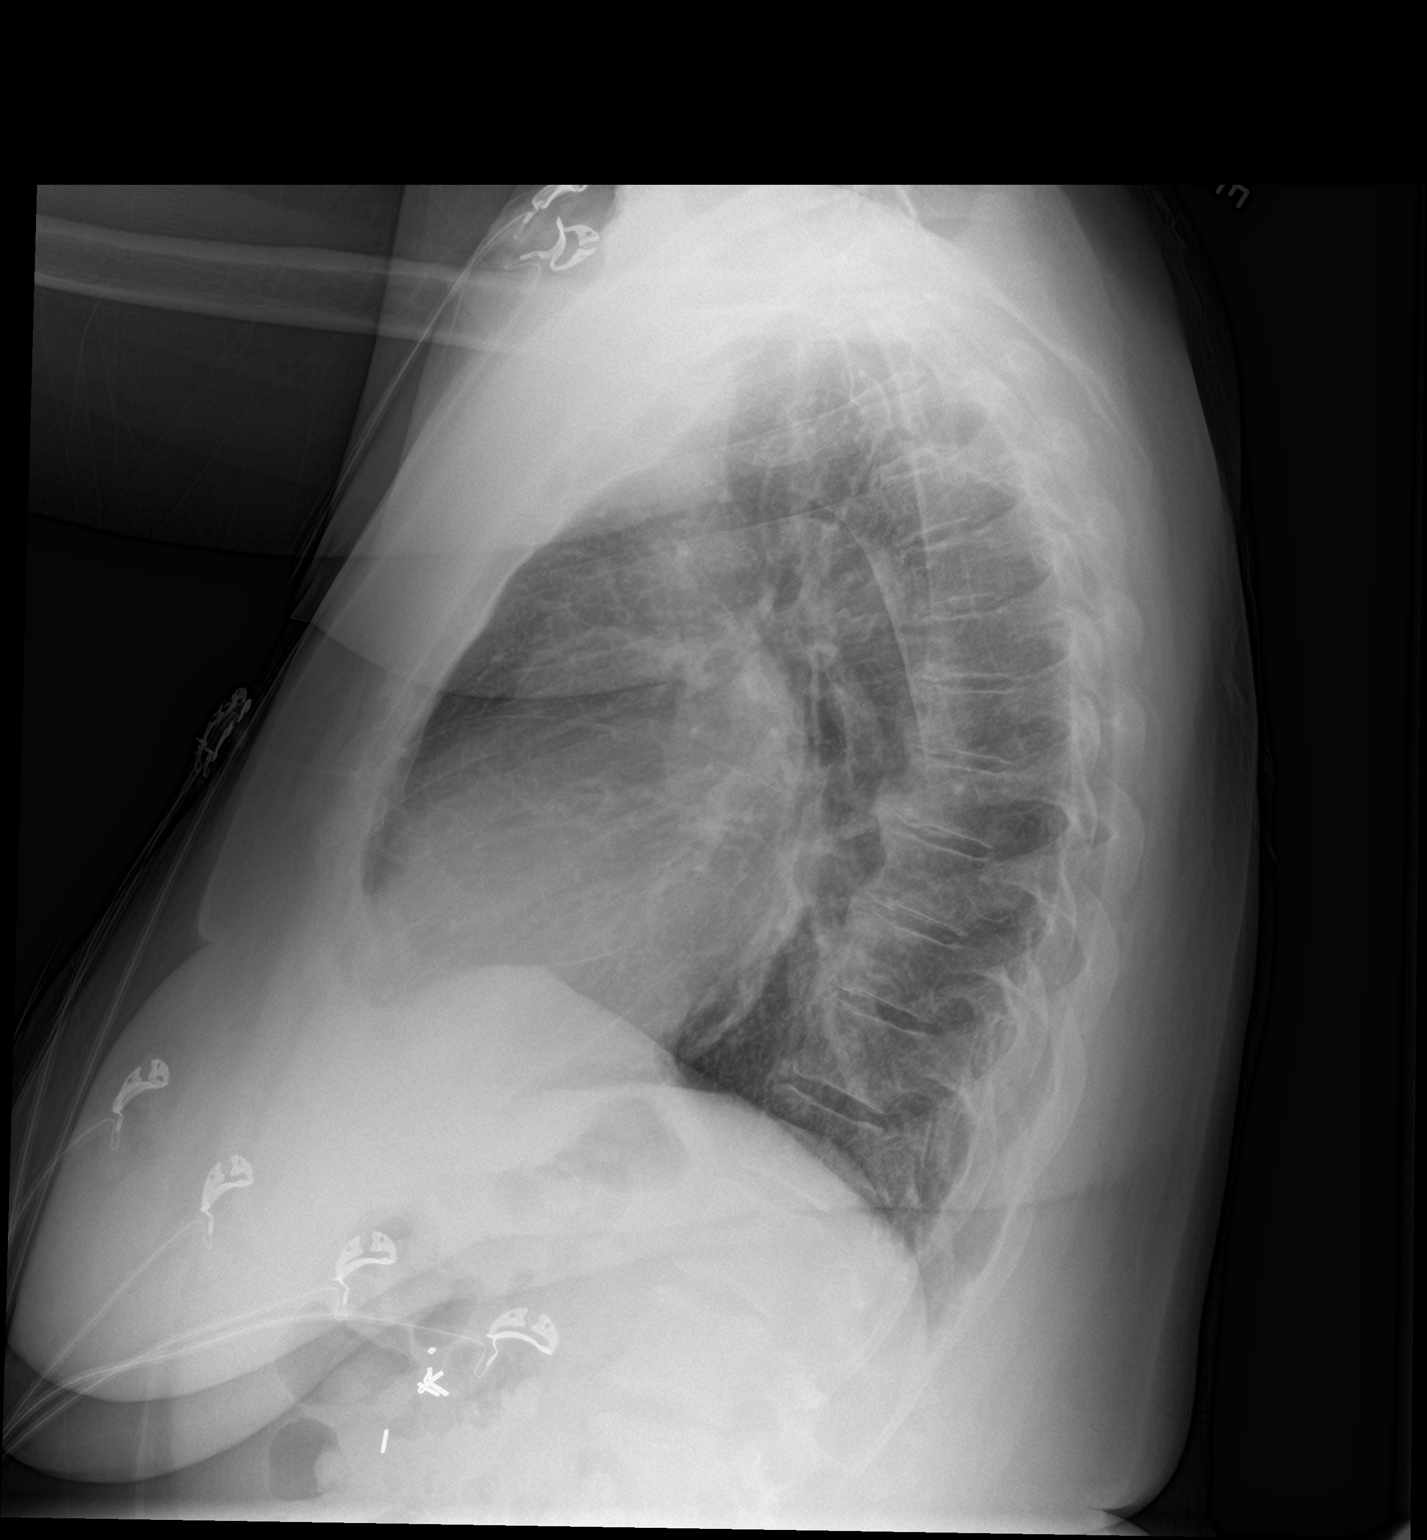

[2 of 2 positions shown; findings below may reference images not displayed]

FINDINGS: EKG leads project over the chest.

Cardiomediastinal contours and hilar structures with cardiac
enlargement and fullness of central pulmonary vasculature. No lobar
consolidation. No pneumothorax. No sign of pleural effusion.

On limited assessment there is no acute skeletal process.
IMPRESSION: Cardiomegaly with central pulmonary vascular fullness. No signs of
edema or effusion.

## 2022-12-19 IMAGING — US US EXTREM LOW VENOUS*R*
1 series · 14 of 24 positions shown · non-contrast
Comparison: None.

CLINICAL DATA: Right leg swelling

EXAM:
RIGHT LOWER EXTREMITY VENOUS DOPPLER ULTRASOUND
TECHNIQUE: Gray-scale sonography with compression, as well as color and duplex
ultrasound, were performed to evaluate the deep venous system(s)
from the level of the common femoral vein through the popliteal and
proximal calf veins.

[Series 1: us extrem low venous*right* · 14 of 29 slices shown]
[im 1/29]
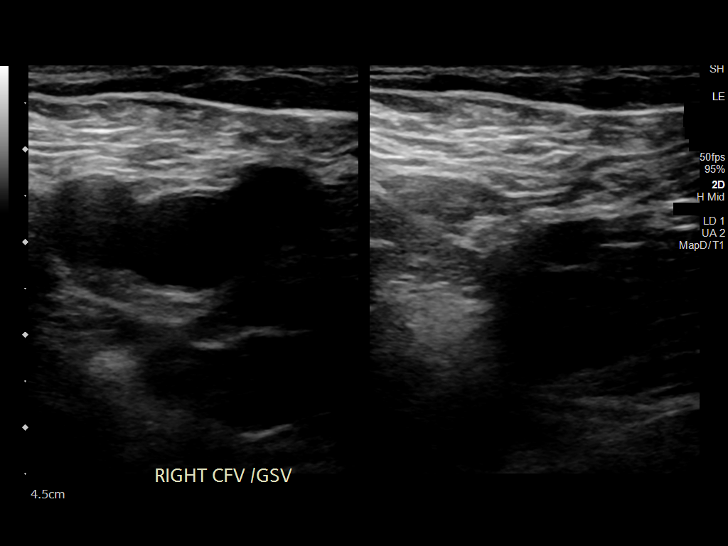
[im 3/29]
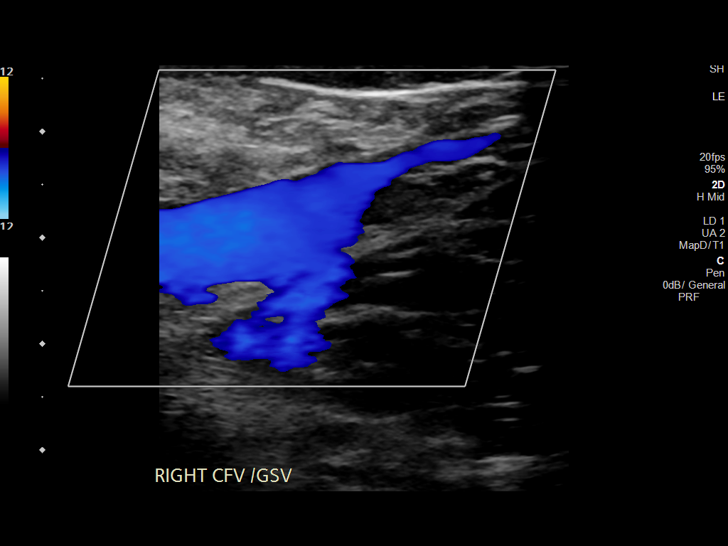
[im 5/29]
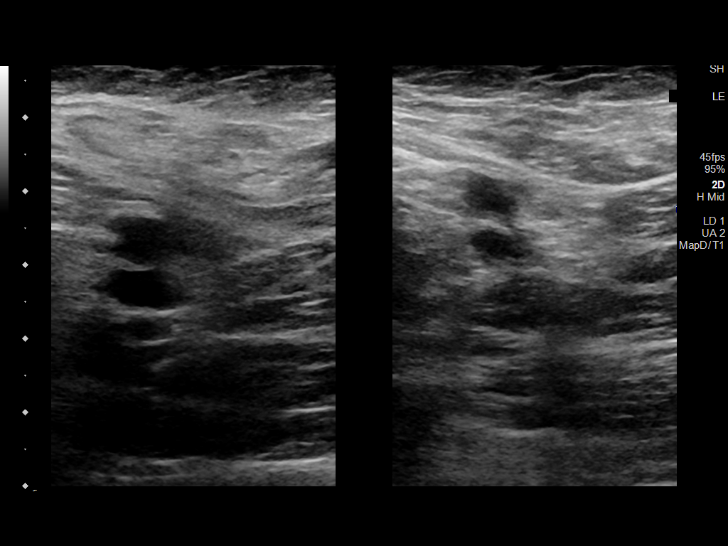
[im 8/29]
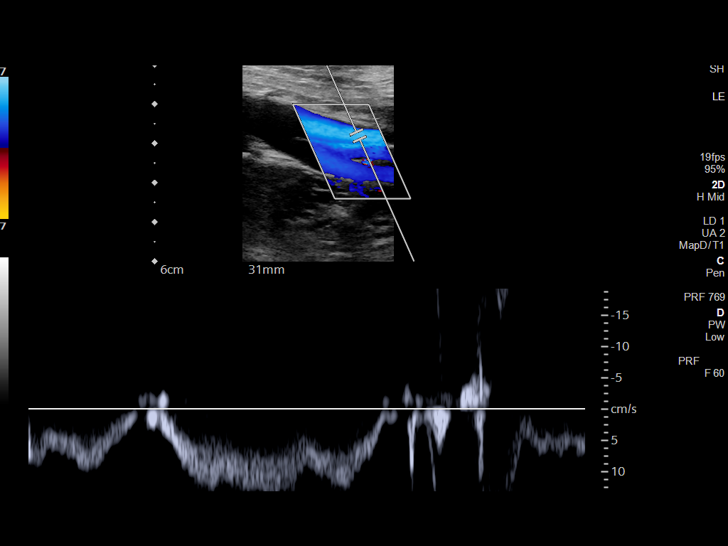
[im 9/29]
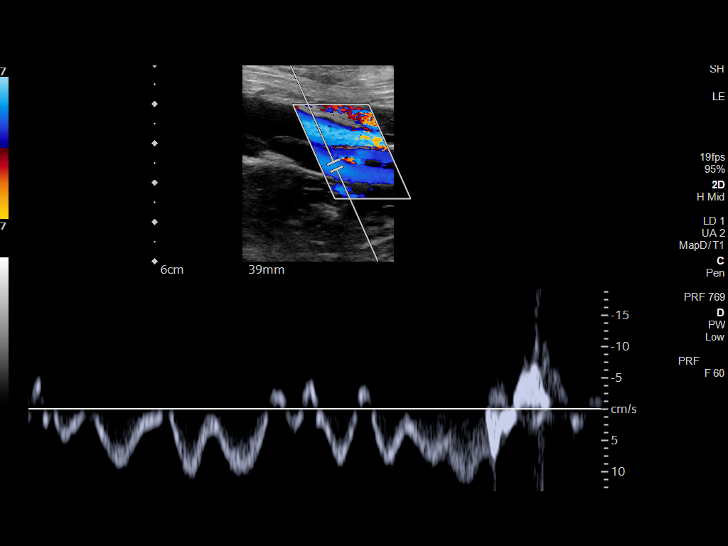
[im 11/29]
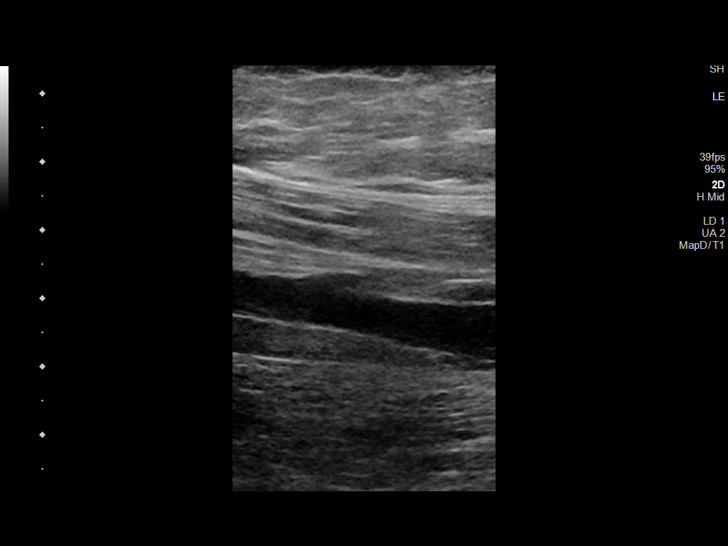
[im 14/29]
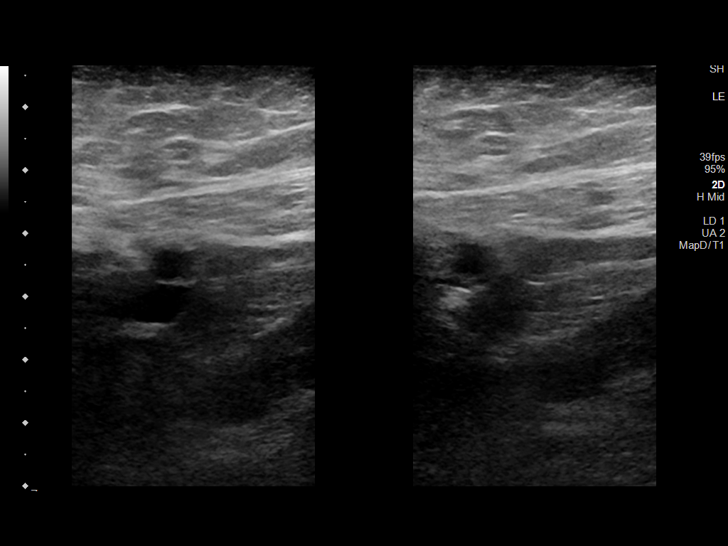
[im 15/29]
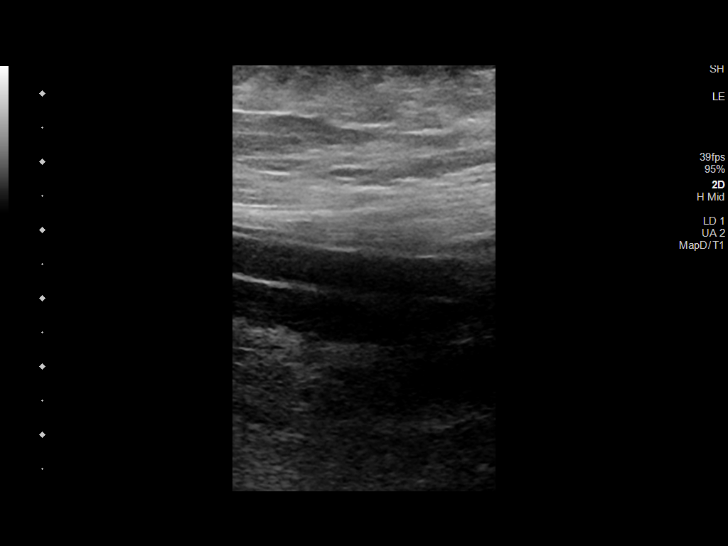
[im 18/29]
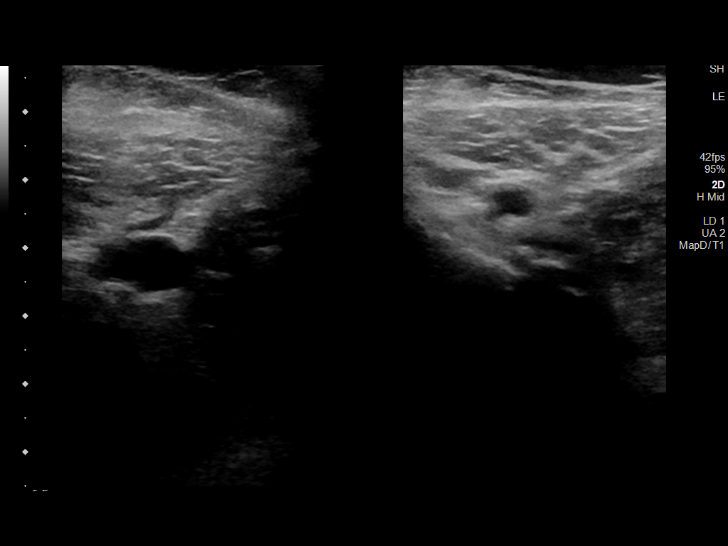
[im 20/29]
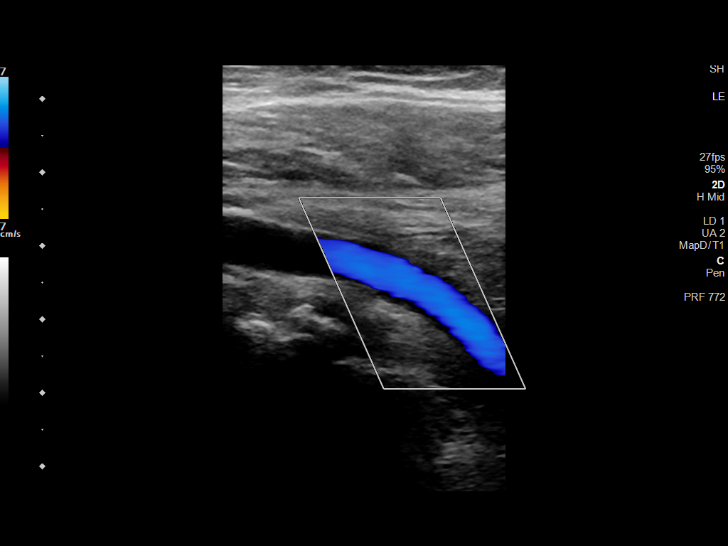
[im 22/29]
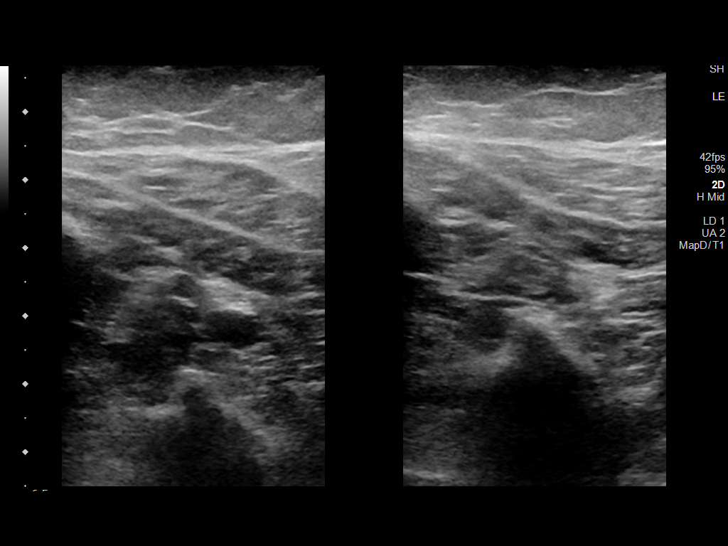
[im 24/29]
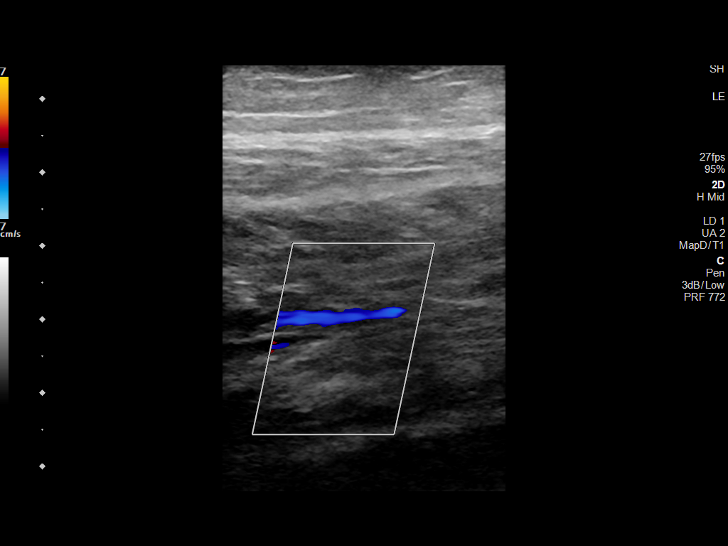
[im 26/29]
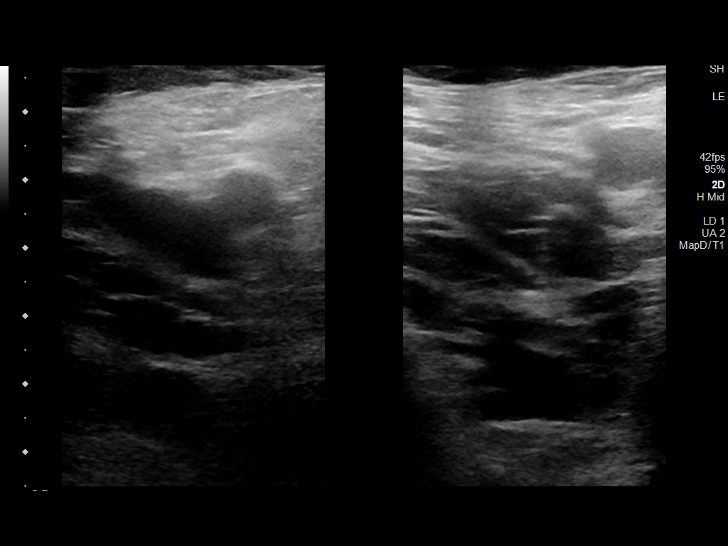
[im 29/29]
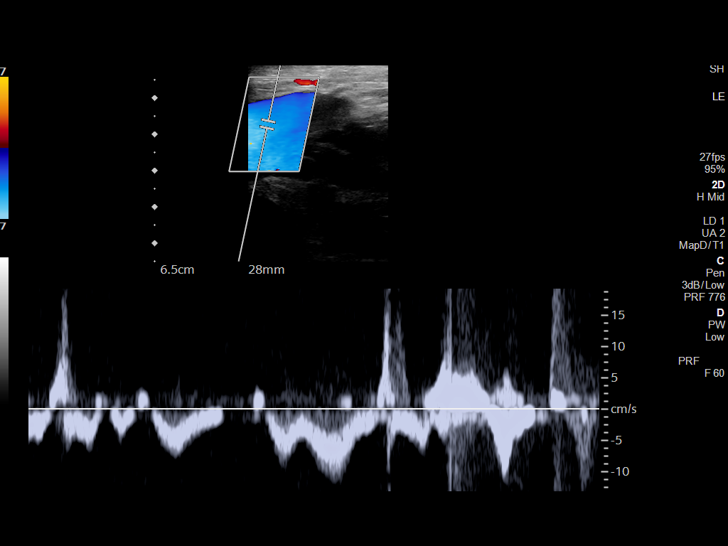

[14 of 24 positions shown; findings below may reference images not displayed]

FINDINGS: VENOUS

Normal compressibility of the common femoral, superficial femoral,
and popliteal veins, as well as the visualized calf veins.
Visualized portions of profunda femoral vein and great saphenous
vein unremarkable. No filling defects to suggest DVT on grayscale or
color Doppler imaging. Doppler waveforms show normal direction of
venous flow, normal respiratory plasticity and response to
augmentation.

Limited views of the contralateral common femoral vein are
unremarkable.

OTHER

None.

Limitations: none
IMPRESSION: Negative.

## 2022-12-21 ENCOUNTER — Ambulatory Visit (HOSPITAL_COMMUNITY)
Admission: RE | Admit: 2022-12-21 | Discharge: 2022-12-21 | Disposition: A | Discharge status: Home / Self Care | Payer: 59 | Source: Ambulatory Visit | Attending: Physician Assistant | Admitting: Physician Assistant

## 2022-12-21 DIAGNOSIS — R9431 Abnormal electrocardiogram [ECG] [EKG]: Secondary | ICD-10-CM | POA: Diagnosis not present

## 2022-12-21 DIAGNOSIS — I4811 Longstanding persistent atrial fibrillation: Secondary | ICD-10-CM | POA: Diagnosis not present

## 2022-12-21 DIAGNOSIS — R0600 Dyspnea, unspecified: Secondary | ICD-10-CM | POA: Diagnosis present

## 2022-12-21 DIAGNOSIS — I371 Nonrheumatic pulmonary valve insufficiency: Secondary | ICD-10-CM | POA: Insufficient documentation

## 2022-12-21 DIAGNOSIS — R42 Dizziness and giddiness: Secondary | ICD-10-CM | POA: Insufficient documentation

## 2022-12-21 LAB — ECHOCARDIOGRAM COMPLETE
Area-P 1/2: 3.99 cm2
Calc EF: 65.5 %
S' Lateral: 2 cm
Single Plane A2C EF: 64.9 %
Single Plane A4C EF: 68.2 %

## 2022-12-21 NOTE — Progress Notes (Signed)
  Echocardiogram 2D Echocardiogram has been performed.  Bobbye Charleston 12/21/2022, 3:46 PM

## 2023-01-11 ENCOUNTER — Ambulatory Visit (HOSPITAL_COMMUNITY)
Admission: RE | Admit: 2023-01-11 | Discharge: 2023-01-11 | Disposition: A | Discharge status: Home / Self Care | Payer: 59 | Source: Ambulatory Visit | Attending: Physician Assistant | Admitting: Physician Assistant

## 2023-01-11 VITALS — BP 156/84 | HR 74 | Ht 59.0 in | Wt 168.2 lb

## 2023-01-11 DIAGNOSIS — I10 Essential (primary) hypertension: Secondary | ICD-10-CM | POA: Insufficient documentation

## 2023-01-11 DIAGNOSIS — I340 Nonrheumatic mitral (valve) insufficiency: Secondary | ICD-10-CM | POA: Insufficient documentation

## 2023-01-11 DIAGNOSIS — Z7901 Long term (current) use of anticoagulants: Secondary | ICD-10-CM | POA: Insufficient documentation

## 2023-01-11 DIAGNOSIS — I371 Nonrheumatic pulmonary valve insufficiency: Secondary | ICD-10-CM | POA: Diagnosis not present

## 2023-01-11 DIAGNOSIS — G4733 Obstructive sleep apnea (adult) (pediatric): Secondary | ICD-10-CM | POA: Insufficient documentation

## 2023-01-11 DIAGNOSIS — I4811 Longstanding persistent atrial fibrillation: Secondary | ICD-10-CM

## 2023-01-11 DIAGNOSIS — E785 Hyperlipidemia, unspecified: Secondary | ICD-10-CM | POA: Insufficient documentation

## 2023-01-11 DIAGNOSIS — G40909 Epilepsy, unspecified, not intractable, without status epilepticus: Secondary | ICD-10-CM | POA: Diagnosis not present

## 2023-01-11 NOTE — Progress Notes (Signed)
Primary Care Physician: [facility] Primary Cardiologist: none Primary Electrophysiologist: none Referring Physician: [facility]   Kathleen Moreno is a 62 y.o. female with a history of HLD, OSA, HTN, seizure disorder, atrial fibrillation who presents for follow up in the [facility].  The patient was initially diagnosed with atrial fibrillation remotely and was followed at [facility] cardiology but her physician retired and she has not seen cardiology since. Per patient report, her care team discussed DCCV with her but she was to afraid of the procedure. ECGs in Epic show afib since 01/2021. Patient is on Xarelto for a CHADS2VASC score of 2. She was seen at the ED 10/17/22 for chest pain and SOB. Workup at that time was unremarkable, referred to AF clinic. She is not on CPAP therapy for her OSA.  On follow up today, patient reports that she feels well today, no change from her last visit. She remains in rate controlled afib. She does get fatigued with exertion which has been chronic for her. No bleeding issues on anticoagulation.   Today, she denies symptoms of palpitations, chest pain, shortness of breath, orthopnea, PND, lower extremity edema, dizziness, presyncope, syncope, bleeding, or neurologic sequela. The patient is tolerating medications without difficulties and is otherwise without complaint today.    Atrial Fibrillation Risk Factors:  she does have symptoms or diagnosis of sleep apnea. she is not compliant with CPAP therapy. she does not have a history of rheumatic fever.   she has a BMI of Body mass index is 33.97 kg/m.Marland Kitchen Filed Weights   01/11/23 1558  Weight: 76.3 kg     No family history on file.   Atrial Fibrillation Management history:  Previous antiarrhythmic drugs: none Previous cardioversions: none Previous ablations: none CHADS2VASC score: 2 Anticoagulation history: Xarelto   Past Medical History:  Diagnosis Date    Asthma    Atrial fibrillation (HCC)    Fibromyalgia    Hypercholesteremia    Hypertension    Seizure disorder (HCC)    Past Surgical History:  Procedure Laterality Date   CESAREAN SECTION     times 3   CHOLECYSTECTOMY      Current Outpatient Medications  Medication Sig Dispense Refill   albuterol (VENTOLIN HFA) 108 (90 Base) MCG/ACT inhaler INHALE 2 PUFFS BY MOUTH EVERY 6 HOURS AS NEEDED FOR WHEEZING     Blood Pressure Monitoring (BLOOD PRESSURE KIT) KIT 1 Device by Misc.(Non-Drug; Combo Route) route 2 (two) times daily as needed (for monitoring of Hypertenison).     budesonide-formoterol (SYMBICORT) 160-4.5 MCG/ACT inhaler 2 puffs bid rinsing mouth out after each use     Cholecalciferol 25 MCG (1000 UT) tablet Take 1,000 Units by mouth daily.     cyanocobalamin 1000 MCG tablet Take 1,000 mcg by mouth every morning.     hydrochlorothiazide (HYDRODIURIL) 12.5 MG tablet Take 12.5 mg by mouth daily.     lidocaine (LIDODERM) 5 % Place 1 patch onto the skin daily. Remove & Discard patch within 12 hours or as directed by MD 30 patch 0   LINZESS 145 MCG CAPS capsule Take 145 mcg by mouth daily.     LORazepam (ATIVAN) 0.5 MG tablet Take 1 tablet by mouth daily as needed.     losartan (COZAAR) 50 MG tablet Take 50 mg by mouth daily.     oxcarbazepine (TRILEPTAL) 600 MG tablet Take 1 tablet by mouth 2 (two) times daily.     potassium chloride (MICRO-K) 10 MEQ  CR capsule TAKE 1 CAPSULE BY MOUTH TWICE DAILY WITH FOOD     rivaroxaban (XARELTO) 20 MG TABS tablet Take 20 mg by mouth daily with supper.     simvastatin (ZOCOR) 40 MG tablet Take 1 tablet by mouth at bedtime.     No current facility-administered medications for this encounter.    Allergies  Allergen Reactions   Codeine     Social History   Socioeconomic History   Marital status: Widowed    Spouse name: Not on file   Number of children: Not on file   Years of education: Not on file   Highest education level: Not on file   Occupational History   Not on file  Tobacco Use   Smoking status: Never   Smokeless tobacco: Never   Tobacco comments:    Never smoke 11/30/22  Vaping Use   Vaping Use: Never used  Substance and Sexual Activity   Alcohol use: Never   Drug use: Never   Sexual activity: Not on file  Other Topics Concern   Not on file  Social History Narrative   Not on file   Social Determinants of Health   Financial Resource Strain: Not on file  Food Insecurity: Not on file  Transportation Needs: Not on file  Physical Activity: Not on file  Stress: Not on file  Social Connections: Not on file  Intimate Partner Violence: Not on file     ROS- All systems are reviewed and negative except as per the HPI above.  Physical Exam: Vitals:   01/11/23 1558  BP: (!) 156/84  Pulse: 74  Weight: 76.3 kg  Height: '4\' 11"'$  (1.499 m)     GEN- The patient is a well appearing obese female, alert and oriented x 3 today.   HEENT-head normocephalic, atraumatic, sclera clear, conjunctiva pink, hearing intact, trachea midline. Lungs- Clear to ausculation bilaterally, normal work of breathing Heart- irregular rate and rhythm, no murmurs, rubs or gallops  GI- soft, NT, ND, + BS Extremities- no clubbing, cyanosis, or edema MS- no significant deformity or atrophy Skin- no rash or lesion Psych- euthymic mood, full affect Neuro- strength and sensation are intact   Wt Readings from Last 3 Encounters:  01/11/23 76.3 kg  11/30/22 74.8 kg  11/01/22 75.3 kg    EKG today demonstrates  Afib, RBBB, LAFB Vent. rate 74 BPM PR interval * ms QRS duration 124 ms QT/QTcB 410/455 ms  Echo 12/21/22  1. Global longitudinal strain is -21.5% Normal.. Left ventricular  ejection fraction, by estimation, is 70 to 75%. The left ventricle has  hyperdynamic function. The left ventricle has no regional wall motion  abnormalities. There is mild left ventricular hypertrophy. Left ventricular diastolic parameters are  indeterminate.   2. Right ventricular systolic function is normal. The right ventricular  size is normal. There is normal pulmonary artery systolic pressure.   3. The mitral valve is normal in structure. Mild mitral valve  regurgitation.   4. The aortic valve is tricuspid. Aortic valve regurgitation is not  visualized. Aortic valve sclerosis is present, with no evidence of aortic  valve stenosis.   5. Pulmonic valve regurgitation is moderate.   6. The inferior vena cava is normal in size with greater than 50%  respiratory variability, suggesting right atrial pressure of 3 mmHg.    Epic records are reviewed at length today  CHA2DS2-VASc Score = 2  The patient's score is based upon: CHF History: 0 HTN History: 1 Diabetes History: 0 Stroke  History: 0 Vascular Disease History: 0 Age Score: 0 Gender Score: 1       ASSESSMENT AND PLAN: 1. Longstanding Persistent Atrial Fibrillation (ICD10:  I48.11) The patient's CHA2DS2-VASc score is 2, indicating a 2.2% annual risk of stroke.   In afib today. Suspect her afib in longstanding, no ECG in Epic in SR. There is a report from [place] in 2018 of an ECG in SR. She is rate controlled on no AV nodal agents.  We discussed rhythm control options again today. She will likely need DCCV + AAD to maintain SR. She does not want to undergo DCCV and would like to pursue a rate control option. Afib likely permanent at this point.  Continue Xarelto 20 mg daily  2. Obstructive sleep apnea The importance of adequate treatment of sleep apnea was discussed today in order to improve our ability to maintain sinus rhythm long term. Patient does not want to reconsider CPAP.  3. HTN Stable, no changes today.  4. Valvular heart disease Mild mitral regurgitation Moderate pulmonic regurgitation   Follow up in the AF clinic as needed. Will refer her to establish care with a primary cardiologist in the [facility] area.    [facility] [address] [place], [place] [address] [phone] 01/11/2023 4:47 PM

## 2023-02-07 ENCOUNTER — Emergency Department (HOSPITAL_BASED_OUTPATIENT_CLINIC_OR_DEPARTMENT_OTHER)
Admission: EM | Admit: 2023-02-07 | Discharge: 2023-02-07 | Disposition: A | Discharge status: Home / Self Care | Payer: 59 | Attending: Emergency Medicine | Admitting: Emergency Medicine

## 2023-02-07 ENCOUNTER — Emergency Department (HOSPITAL_BASED_OUTPATIENT_CLINIC_OR_DEPARTMENT_OTHER): Payer: 59

## 2023-02-07 ENCOUNTER — Encounter (HOSPITAL_BASED_OUTPATIENT_CLINIC_OR_DEPARTMENT_OTHER): Payer: Self-pay | Admitting: Emergency Medicine

## 2023-02-07 DIAGNOSIS — R079 Chest pain, unspecified: Secondary | ICD-10-CM | POA: Diagnosis not present

## 2023-02-07 DIAGNOSIS — R0602 Shortness of breath: Secondary | ICD-10-CM | POA: Diagnosis present

## 2023-02-07 DIAGNOSIS — H1132 Conjunctival hemorrhage, left eye: Secondary | ICD-10-CM | POA: Diagnosis not present

## 2023-02-07 DIAGNOSIS — I4811 Longstanding persistent atrial fibrillation: Secondary | ICD-10-CM

## 2023-02-07 LAB — CBC WITH DIFFERENTIAL/PLATELET
Abs Immature Granulocytes: 0.01 10*3/uL (ref 0.00–0.07)
Basophils Absolute: 0 10*3/uL (ref 0.0–0.1)
Basophils Relative: 1 %
Eosinophils Absolute: 0.1 10*3/uL (ref 0.0–0.5)
Eosinophils Relative: 1 %
HCT: 43.4 % (ref 36.0–46.0)
Hemoglobin: 15.1 g/dL — ABNORMAL HIGH (ref 12.0–15.0)
Immature Granulocytes: 0 %
Lymphocytes Relative: 59 %
Lymphs Abs: 3.7 10*3/uL (ref 0.7–4.0)
MCH: 31.1 pg (ref 26.0–34.0)
MCHC: 34.8 g/dL (ref 30.0–36.0)
MCV: 89.3 fL (ref 80.0–100.0)
Monocytes Absolute: 0.4 10*3/uL (ref 0.1–1.0)
Monocytes Relative: 7 %
Neutro Abs: 2 10*3/uL (ref 1.7–7.7)
Neutrophils Relative %: 32 %
Platelets: 295 10*3/uL (ref 150–400)
RBC: 4.86 MIL/uL (ref 3.87–5.11)
RDW: 12.6 % (ref 11.5–15.5)
WBC: 6.3 10*3/uL (ref 4.0–10.5)
nRBC: 0 % (ref 0.0–0.2)

## 2023-02-07 LAB — COMPREHENSIVE METABOLIC PANEL
ALT: 26 U/L (ref 0–44)
AST: 29 U/L (ref 15–41)
Albumin: 3.8 g/dL (ref 3.5–5.0)
Alkaline Phosphatase: 78 U/L (ref 38–126)
Anion gap: 9 (ref 5–15)
BUN: 15 mg/dL (ref 8–23)
CO2: 26 mmol/L (ref 22–32)
Calcium: 8.8 mg/dL — ABNORMAL LOW (ref 8.9–10.3)
Chloride: 98 mmol/L (ref 98–111)
Creatinine, Ser: 0.65 mg/dL (ref 0.44–1.00)
GFR, Estimated: >60 mL/min (ref >60)
Glucose, Bld: 98 mg/dL (ref 70–99)
Potassium: 3.4 mmol/L — ABNORMAL LOW (ref 3.5–5.1)
Sodium: 133 mmol/L — ABNORMAL LOW (ref 135–145)
Total Bilirubin: 0.5 mg/dL (ref 0.3–1.2)
Total Protein: 7 g/dL (ref 6.5–8.1)

## 2023-02-07 LAB — TROPONIN I (HIGH SENSITIVITY): Troponin I (High Sensitivity): 3 ng/L (ref <18)

## 2023-02-07 NOTE — ED Notes (Signed)
D/c paperwork reviewed with pt, including follow up care.  No questions or concerns voiced at time of d/c. . Pt verbalized understanding, Ambulatory without assistance to ED exit, NAD.   

## 2023-02-07 NOTE — ED Provider Notes (Signed)
[place] [facility] Provider Note   CSN: [id number] Arrival date & time: 02/07/23  2110     History  Chief Complaint  Patient presents with   Shortness of Breath   Eye Problem    Kathleen Moreno is a 62 y.o. female with a history of A-fib on Xarelto presenting to the ED with chest pain and shortness of breath and redness in her left eye.  She says she began having some discomfort in her chest earlier today, did not think much about it decided to come to the ER then to get evaluated when she noticed that she was having redness on the white part of her left eye.  She reports that she is constantly in A-fib and cannot feel when she goes in or out of A-fib.  She denies to me shortness of breath  External records reviewed, patient seen in the A-fib clinic most recently 1 month ago in February, she has noted to be on Xarelto for CHA2DS2-VASc score of 2.  She was noted to be in rate controlled A-fib.  She has noted to get fatigued with exertion which is a chronic issue.  She is felt to be likely experiencing permanent A-fib at that point, as the patient had not wanted cardioversion and antiarrhythmic medication  HPI     Home Medications Prior to Admission medications   Medication Sig Start Date End Date Taking? Authorizing Provider  albuterol (VENTOLIN HFA) 108 (90 Base) MCG/ACT inhaler INHALE 2 PUFFS BY MOUTH EVERY 6 HOURS AS NEEDED FOR WHEEZING 02/01/20   [provider]  Blood Pressure Monitoring (BLOOD PRESSURE KIT) KIT 1 Device by Misc.(Non-Drug; Combo Route) route 2 (two) times daily as needed (for monitoring of Hypertenison). 03/29/19   [provider]  budesonide-formoterol (SYMBICORT) 160-4.5 MCG/ACT inhaler 2 puffs bid rinsing mouth out after each use 02/01/20   [provider]  Cholecalciferol 25 MCG (1000 UT) tablet Take 1,000 Units by mouth daily.    [provider]  cyanocobalamin 1000 MCG tablet Take 1,000 mcg  by mouth every morning.    [provider]  hydrochlorothiazide (HYDRODIURIL) 12.5 MG tablet Take 12.5 mg by mouth daily. 12/05/22   [provider]  lidocaine (LIDODERM) 5 % Place 1 patch onto the skin daily. Remove & Discard patch within 12 hours or as directed by MD 12/31/21   Randal Buba, April, MD  LINZESS 145 MCG CAPS capsule Take 145 mcg by mouth daily. 06/23/22   [provider]  LORazepam (ATIVAN) 0.5 MG tablet Take 1 tablet by mouth daily as needed. 10/11/19   [provider]  losartan (COZAAR) 50 MG tablet Take 50 mg by mouth daily. 08/13/20   [provider]  oxcarbazepine (TRILEPTAL) 600 MG tablet Take 1 tablet by mouth 2 (two) times daily. 08/06/20   [provider]  potassium chloride (MICRO-K) 10 MEQ CR capsule TAKE 1 CAPSULE BY MOUTH TWICE DAILY WITH FOOD 06/27/20   [provider]  rivaroxaban (XARELTO) 20 MG TABS tablet Take 20 mg by mouth daily with supper.    [provider]  simvastatin (ZOCOR) 40 MG tablet Take 1 tablet by mouth at bedtime. 08/29/20   [provider]      Allergies    Codeine    Review of Systems   Review of Systems  Physical Exam Updated Vital Signs BP (!) 167/103   Pulse 86   Temp 97.7 F (36.5 C) (Oral)   Resp 20  Ht 4\' 11"  (1.499 m)   Wt 75.8 kg   SpO2 100%   BMI 33.73 kg/m  Physical Exam Constitutional:      General: She is not in acute distress. HENT:     Head: Normocephalic and atraumatic.  Eyes:     Conjunctiva/sclera: Conjunctivae normal.     Comments: Left eye with subconjunctival hemorrhage, pupil unaffected  Cardiovascular:     Rate and Rhythm: Normal rate. Rhythm irregular.  Pulmonary:     Effort: Pulmonary effort is normal. No respiratory distress.  Abdominal:     General: There is no distension.     Tenderness: There is no abdominal tenderness.  Skin:    General: Skin is warm and dry.  Neurological:     General: No focal deficit present.      Mental Status: She is alert. Mental status is at baseline.  Psychiatric:        Mood and Affect: Mood normal.        Behavior: Behavior normal.     ED Results / Procedures / Treatments   Labs (all labs ordered are listed, but only abnormal results are displayed) Labs Reviewed  CBC WITH DIFFERENTIAL/PLATELET - Abnormal; Notable for the following components:      Result Value   Hemoglobin 15.1 (*)    All other components within normal limits  COMPREHENSIVE METABOLIC PANEL - Abnormal; Notable for the following components:   Sodium 133 (*)    Potassium 3.4 (*)    Calcium 8.8 (*)    All other components within normal limits  TROPONIN I (HIGH SENSITIVITY)    EKG EKG Interpretation  Date/Time:  Sunday February 07 2023 21:24:37 EDT Ventricular Rate:  74 PR Interval:    QRS Duration: 130 QT Interval:  418 QTC Calculation: 464 R Axis:   -56 Text Interpretation: Atrial fibrillation RBBB and LAFB Confirmed by Octaviano Glow [phone]) on 02/07/2023 9:30:01 PM  Radiology DG Chest 2 View  Result Date: 02/07/2023 CLINICAL DATA:  CP EXAM: CHEST - 2 VIEW COMPARISON:  Chest x-ray 11/01/2022 FINDINGS: The heart and mediastinal contours are within normal limits. No focal consolidation. No pulmonary edema. No pleural effusion. No pneumothorax. No acute osseous abnormality. IMPRESSION: No active cardiopulmonary disease. Electronically Signed   By: Iven Finn M.D.   On: 02/07/2023 22:06    Procedures Procedures    Medications Ordered in ED Medications - No data to display  ED Course/ Medical Decision Making/ A&P                             Medical Decision Making Amount and/or Complexity of Data Reviewed Labs: ordered. Radiology: ordered.   Patient is here with palpitations, some nonspecific chest discomfort, and also found to have a subconjunctival hemorrhage on exam.  I do not see evidence of intraocular emergency.  There is no visual change.  I do not believe she is requiring  further emergent imaging or testing of the eye.  Her chest discomfort is extremely nonspecific, I have a low suspicion for ACS.  She does not have shortness of breath for me.  I personally reviewed and interpreted patient's labs and x-ray imaging, notable for no emergent findings  I reviewed her external records including her cardiology A-fib clinic office notes from last month as noted above.  She has a documented history of suspected permanent A-fib.  She is compliant with her Xarelto.  I will low suspicion for acute PE  and do not see an indication for CT angiogram.  The patient's vital signs have otherwise been unremarkable.  Heart rate is controlled.  She is stable for discharge.        Final Clinical Impression(s) / ED Diagnoses Final diagnoses:  Subconjunctival hemorrhage of left eye  Longstanding persistent atrial fibrillation [facility])    Rx / DC Orders ED Discharge Orders     None         Wyvonnia Dusky, MD 02/07/23 2242

## 2023-02-07 NOTE — ED Triage Notes (Signed)
Pt c/o CP and SHOB since around 1900; hx of afib; also has reddened sclera to inner part of LT eye- no injury, denies pain

## 2023-09-22 ENCOUNTER — Encounter (HOSPITAL_BASED_OUTPATIENT_CLINIC_OR_DEPARTMENT_OTHER): Payer: Self-pay

## 2023-09-22 ENCOUNTER — Emergency Department (HOSPITAL_BASED_OUTPATIENT_CLINIC_OR_DEPARTMENT_OTHER)
Admission: EM | Admit: 2023-09-22 | Discharge: 2023-09-22 | Disposition: A | Discharge status: Home / Self Care | Payer: 59 | Attending: Emergency Medicine | Admitting: Emergency Medicine

## 2023-09-22 ENCOUNTER — Other Ambulatory Visit: Payer: Self-pay

## 2023-09-22 ENCOUNTER — Emergency Department (HOSPITAL_BASED_OUTPATIENT_CLINIC_OR_DEPARTMENT_OTHER): Payer: 59

## 2023-09-22 DIAGNOSIS — Z7901 Long term (current) use of anticoagulants: Secondary | ICD-10-CM | POA: Insufficient documentation

## 2023-09-22 DIAGNOSIS — I482 Chronic atrial fibrillation, unspecified: Secondary | ICD-10-CM | POA: Insufficient documentation

## 2023-09-22 DIAGNOSIS — R002 Palpitations: Secondary | ICD-10-CM | POA: Diagnosis present

## 2023-09-22 DIAGNOSIS — Z79899 Other long term (current) drug therapy: Secondary | ICD-10-CM | POA: Insufficient documentation

## 2023-09-22 DIAGNOSIS — M1711 Unilateral primary osteoarthritis, right knee: Secondary | ICD-10-CM | POA: Insufficient documentation

## 2023-09-22 DIAGNOSIS — I10 Essential (primary) hypertension: Secondary | ICD-10-CM | POA: Diagnosis not present

## 2023-09-22 LAB — BASIC METABOLIC PANEL
Anion gap: 11 (ref 5–15)
BUN: 11 mg/dL (ref 8–23)
CO2: 26 mmol/L (ref 22–32)
Calcium: 8.7 mg/dL — ABNORMAL LOW (ref 8.9–10.3)
Chloride: 94 mmol/L — ABNORMAL LOW (ref 98–111)
Creatinine, Ser: 0.71 mg/dL (ref 0.44–1.00)
GFR, Estimated: >60 mL/min (ref >60)
Glucose, Bld: 89 mg/dL (ref 70–99)
Potassium: 3.8 mmol/L (ref 3.5–5.1)
Sodium: 131 mmol/L — ABNORMAL LOW (ref 135–145)

## 2023-09-22 LAB — CBC
HCT: 40.9 % (ref 36.0–46.0)
Hemoglobin: 14.2 g/dL (ref 12.0–15.0)
MCH: 30.8 pg (ref 26.0–34.0)
MCHC: 34.7 g/dL (ref 30.0–36.0)
MCV: 88.7 fL (ref 80.0–100.0)
Platelets: 303 10*3/uL (ref 150–400)
RBC: 4.61 MIL/uL (ref 3.87–5.11)
RDW: 12.4 % (ref 11.5–15.5)
WBC: 4.9 10*3/uL (ref 4.0–10.5)
nRBC: 0 % (ref 0.0–0.2)

## 2023-09-22 LAB — TROPONIN I (HIGH SENSITIVITY): Troponin I (High Sensitivity): 3 ng/L (ref <18)

## 2023-09-22 NOTE — ED Notes (Signed)
Discharge paperwork reviewed entirely with patient, including follow up care. Pain was under control. No prescriptions were called in, but all questions were addressed.  Pt verbalized understanding as well as all parties involved. No questions or concerns voiced at the time of discharge. No acute distress noted.   Pt ambulated out to PVA without incident or assistance.  Pt advised they will notify their PCP immediately., Pt advised they will seek followup care with a specialist and followup with their PCP. , and Pt was given information to obtain and notify a PCP.

## 2023-09-22 NOTE — ED Provider Notes (Signed)
[place] [facility] Provider Note   CSN: [id number] Arrival date & time: 09/22/23  1804     History  Chief Complaint  Patient presents with   Palpitations    Kathleen Moreno is a 62 y.o. female.  With a past medical history of atrial fibrillation on Xarelto, hypertension, hyperlipidemia, fibromyalgia who presents to the ED for palpitations.  Patient first experienced sharp right knee pain beginning this morning which worsened through the day.  There was no inciting injury or recent trauma.  In the afternoon she began to have sensation of palpitations and chest discomfort.  No shortness of breath nausea vomiting diaphoresis.  No fevers chills or recent illness.   Palpitations      Home Medications Prior to Admission medications   Medication Sig Start Date End Date Taking? Authorizing Provider  albuterol (VENTOLIN HFA) 108 (90 Base) MCG/ACT inhaler INHALE 2 PUFFS BY MOUTH EVERY 6 HOURS AS NEEDED FOR WHEEZING 02/01/20   [provider]  Blood Pressure Monitoring (BLOOD PRESSURE KIT) KIT 1 Device by Misc.(Non-Drug; Combo Route) route 2 (two) times daily as needed (for monitoring of Hypertenison). 03/29/19   [provider]  budesonide-formoterol (SYMBICORT) 160-4.5 MCG/ACT inhaler 2 puffs bid rinsing mouth out after each use 02/01/20   [provider]  Cholecalciferol 25 MCG (1000 UT) tablet Take 1,000 Units by mouth daily.    [provider]  cyanocobalamin 1000 MCG tablet Take 1,000 mcg by mouth every morning.    [provider]  hydrochlorothiazide (HYDRODIURIL) 12.5 MG tablet Take 12.5 mg by mouth daily. 12/05/22   [provider]  lidocaine (LIDODERM) 5 % Place 1 patch onto the skin daily. Remove & Discard patch within 12 hours or as directed by MD 12/31/21   Nicanor Alcon, April, MD  LINZESS 145 MCG CAPS capsule Take 145 mcg by mouth daily. 06/23/22   [provider]  LORazepam (ATIVAN) 0.5 MG  tablet Take 1 tablet by mouth daily as needed. 10/11/19   [provider]  losartan (COZAAR) 50 MG tablet Take 50 mg by mouth daily. 08/13/20   [provider]  oxcarbazepine (TRILEPTAL) 600 MG tablet Take 1 tablet by mouth 2 (two) times daily. 08/06/20   [provider]  potassium chloride (MICRO-K) 10 MEQ CR capsule TAKE 1 CAPSULE BY MOUTH TWICE DAILY WITH FOOD 06/27/20   [provider]  rivaroxaban (XARELTO) 20 MG TABS tablet Take 20 mg by mouth daily with supper.    [provider]  simvastatin (ZOCOR) 40 MG tablet Take 1 tablet by mouth at bedtime. 08/29/20   [provider]      Allergies    Codeine    Review of Systems   Review of Systems  Cardiovascular:  Positive for palpitations.    Physical Exam Updated Vital Signs BP 133/81   Pulse 75   Temp 97.7 F (36.5 C) (Oral)   Resp 18   Ht 4\' 11"  (1.499 m)   Wt 76.2 kg   SpO2 100%   BMI 33.93 kg/m  Physical Exam Vitals and nursing note reviewed.  HENT:     Head: Normocephalic and atraumatic.  Eyes:     Pupils: Pupils are equal, round, and reactive to light.  Cardiovascular:     Rate and Rhythm: Normal rate. Rhythm irregular.  Pulmonary:     Effort: Pulmonary effort is normal.     Breath sounds: Normal breath sounds.  Abdominal:     Palpations: Abdomen is  soft.     Tenderness: There is no abdominal tenderness.  Musculoskeletal:     Comments: Full active range of motion with flexion extension of right knee 2+ DP pulses bilaterally Sensation tact light touch throughout lower extremities Mild anterior tenderness to palpation of right knee with no significant swelling or erythema  Skin:    General: Skin is warm and dry.  Neurological:     Mental Status: She is alert.  Psychiatric:        Mood and Affect: Mood normal.     ED Results / Procedures / Treatments   Labs (all labs ordered are listed, but only abnormal results are displayed) Labs Reviewed  BASIC  METABOLIC PANEL - Abnormal; Notable for the following components:      Result Value   Sodium 131 (*)    Chloride 94 (*)    Calcium 8.7 (*)    All other components within normal limits  CBC  TROPONIN I (HIGH SENSITIVITY)  TROPONIN I (HIGH SENSITIVITY)    EKG EKG Interpretation Date/Time:  Wednesday September 22 2023 18:17:56 EST Ventricular Rate:  65 PR Interval:    QRS Duration:  80 QT Interval:  430 QTC Calculation: 447 R Axis:   -35  Text Interpretation: Atrial fibrillation Inferior infarct , age undetermined Anterior infarct , age undetermined Abnormal ECG When compared with ECG of 07-Feb-2023 21:24, PREVIOUS ECG IS PRESENT Confirmed by Estelle [phone]) on 09/22/2023 9:41:20 PM  Radiology DG Knee Complete 4 Views Right  Result Date: 09/22/2023 CLINICAL DATA:  Intermittent right knee pain EXAM: RIGHT KNEE - COMPLETE 4+ VIEW COMPARISON:  None Available. FINDINGS: Frontal, bilateral oblique, lateral views of the right knee are obtained. No acute fracture, subluxation, or dislocation. There is moderate 3 compartmental osteoarthritis greatest in the patellofemoral compartment. Small joint effusion. Soft tissues are unremarkable. IMPRESSION: 1. Moderate 3 compartmental osteoarthritis, with likely reactive suprapatellar joint effusion. 2. No acute fracture. Electronically Signed   By: Sharlet Salina M.D.   On: 09/22/2023 20:14   DG Chest 2 View  Result Date: 09/22/2023 CLINICAL DATA:  Chest pain, atrial fibrillation EXAM: CHEST - 2 VIEW COMPARISON:  02/07/2023 FINDINGS: Frontal and lateral views of the chest demonstrate an unremarkable cardiac silhouette. No airspace disease, effusion, or pneumothorax. No acute bony abnormalities. IMPRESSION: 1. No acute intrathoracic process. Electronically Signed   By: Sharlet Salina M.D.   On: 09/22/2023 20:12    Procedures Procedures    Medications Ordered in ED Medications - No data to display  ED Course/ Medical Decision Making/  A&P Clinical Course as of 09/22/23 2331  Wed Sep 22, 2023  2230 Laboratory workup unremarkable.  High sensitive troponin of 3 not consistent with ACS.  No ischemic changes on EKG.  EKG shows irregular regular rhythm consistent with A-fib.  Rate controlled.  Normotensive.  Chest x-ray shows no acute disease.  Right knee x-ray shows osteoarthritis with small effusion but no other osseous abnormality.  Patient able to ambulate with steady gait here.  Stable for discharge at this time.  She will follow-up with her primary care doctor. [MP]    Clinical Course User Index [MP] Royanne Foots, DO                                 Medical Decision Making 62 year old female with history as above presenting for palpitations and right knee pain.  No inciting injury.  Benign physical exam.  Irregularly irregular rhythm.  Rate controlled.  Normotensive.  Given multiple risk factors we will evaluate for ACS with high sensitive troponin and EKG.  Will obtain basic laboratory workup and imaging including chest x-ray and right knee x-ray.    Other differentials include A-fib with RVR, dysrhythmia, pneumonia and viral respiratory infection Differentials for right knee pain include sprain/strain, fracture, arthritis and gout  Amount and/or Complexity of Data Reviewed Labs: ordered. Radiology: ordered.           Final Clinical Impression(s) / ED Diagnoses Final diagnoses:  Chronic atrial fibrillation (HCC)  Arthritis of right knee    Rx / DC Orders ED Discharge Orders     None         Royanne Foots, DO 09/22/23 2331

## 2023-09-22 NOTE — ED Triage Notes (Signed)
Pt with hx of afib states she feels like her heart is racing, onset tonight around 1600. She reports chest pain and difficulty catching her breath.  She also reports right knee pain that started a couple of days ago and states it hurts to walk. She is ambulatory with independent steady gait.

## 2023-09-22 NOTE — Discharge Instructions (Signed)
You were seen in the emergency ferment for palpitations and right knee pain Your blood work, EKG, right knee x-ray and chest x-ray all looked okay There is no broken bones in your right knee This is most likely related to arthritis There is little bit of swelling seen in the right knee Your EKG showed atrial fibrillation but it is not going too fast and your blood pressure is okay Continue taking Xarelto and all previously prescribed occasions Return to the Emergency Department for chest pain, trouble breathing or any other concerns Otherwise please follow-up with your primary care doctor in 1 week for reevaluation

## 2024-08-28 ENCOUNTER — Emergency Department (HOSPITAL_BASED_OUTPATIENT_CLINIC_OR_DEPARTMENT_OTHER)
Admission: EM | Admit: 2024-08-28 | Discharge: 2024-08-28 | Disposition: A | Discharge status: Home / Self Care | Attending: Emergency Medicine | Admitting: Emergency Medicine

## 2024-08-28 ENCOUNTER — Other Ambulatory Visit: Payer: Self-pay

## 2024-08-28 ENCOUNTER — Encounter (HOSPITAL_BASED_OUTPATIENT_CLINIC_OR_DEPARTMENT_OTHER): Payer: Self-pay | Admitting: Emergency Medicine

## 2024-08-28 ENCOUNTER — Emergency Department (HOSPITAL_BASED_OUTPATIENT_CLINIC_OR_DEPARTMENT_OTHER)

## 2024-08-28 DIAGNOSIS — I482 Chronic atrial fibrillation, unspecified: Secondary | ICD-10-CM | POA: Insufficient documentation

## 2024-08-28 DIAGNOSIS — R079 Chest pain, unspecified: Secondary | ICD-10-CM | POA: Insufficient documentation

## 2024-08-28 DIAGNOSIS — Z7901 Long term (current) use of anticoagulants: Secondary | ICD-10-CM | POA: Diagnosis not present

## 2024-08-28 DIAGNOSIS — Z91148 Patient's other noncompliance with medication regimen for other reason: Secondary | ICD-10-CM | POA: Diagnosis not present

## 2024-08-28 LAB — BASIC METABOLIC PANEL WITH GFR
Anion gap: 9 (ref 5–15)
BUN: 7 mg/dL — ABNORMAL LOW (ref 8–23)
CO2: 27 mmol/L (ref 22–32)
Calcium: 8.9 mg/dL (ref 8.9–10.3)
Chloride: 99 mmol/L (ref 98–111)
Creatinine, Ser: 0.68 mg/dL (ref 0.44–1.00)
GFR, Estimated: >60 mL/min (ref >60)
Glucose, Bld: 104 mg/dL — ABNORMAL HIGH (ref 70–99)
Potassium: 3.6 mmol/L (ref 3.5–5.1)
Sodium: 135 mmol/L (ref 135–145)

## 2024-08-28 LAB — CBC WITH DIFFERENTIAL/PLATELET
Abs Immature Granulocytes: 0.01 K/uL (ref 0.00–0.07)
Basophils Absolute: 0 K/uL (ref 0.0–0.1)
Basophils Relative: 1 %
Eosinophils Absolute: 0.1 K/uL (ref 0.0–0.5)
Eosinophils Relative: 1 %
HCT: 43 % (ref 36.0–46.0)
Hemoglobin: 14.9 g/dL (ref 12.0–15.0)
Immature Granulocytes: 0 %
Lymphocytes Relative: 64 %
Lymphs Abs: 2.3 K/uL (ref 0.7–4.0)
MCH: 31 pg (ref 26.0–34.0)
MCHC: 34.7 g/dL (ref 30.0–36.0)
MCV: 89.6 fL (ref 80.0–100.0)
Monocytes Absolute: 0.3 K/uL (ref 0.1–1.0)
Monocytes Relative: 8 %
Neutro Abs: 0.9 K/uL — ABNORMAL LOW (ref 1.7–7.7)
Neutrophils Relative %: 26 %
Platelets: 284 K/uL (ref 150–400)
RBC: 4.8 MIL/uL (ref 3.87–5.11)
RDW: 12.4 % (ref 11.5–15.5)
WBC: 3.6 K/uL — ABNORMAL LOW (ref 4.0–10.5)
nRBC: 0 % (ref 0.0–0.2)

## 2024-08-28 LAB — TROPONIN T, HIGH SENSITIVITY: Troponin T High Sensitivity: <15 ng/L (ref 0–19)

## 2024-08-28 MED ORDER — FAMOTIDINE 20 MG PO TABS
20.0000 mg | ORAL_TABLET | Freq: Once | ORAL | Status: AC
  Administered 2024-08-28: 20 mg via ORAL
  Filled 2024-08-28: qty 1

## 2024-08-28 MED ORDER — PANTOPRAZOLE SODIUM 20 MG PO TBEC: 20.0000 mg | DELAYED_RELEASE_TABLET | Freq: Every day | ORAL | 0 refills | Status: AC

## 2024-08-28 MED ORDER — ACETAMINOPHEN 500 MG PO TABS
1000.0000 mg | ORAL_TABLET | Freq: Once | ORAL | Status: AC
Start: 2024-08-28 — End: 2024-08-28
  Administered 2024-08-28: 1000 mg via ORAL
  Filled 2024-08-28: qty 2

## 2024-08-28 NOTE — ED Triage Notes (Signed)
 Chest pain starting yesterday, Hx of A-fib, tried ot wait but decided to come in today.

## 2024-08-28 NOTE — ED Provider Notes (Signed)
 [place] [facility] Provider Note   CSN: [id number] Arrival date & time: 08/28/24  9761     History Chief Complaint  Patient presents with   Chest Pain    HPI Kathleen Moreno is a 63 y.o. female presenting for chief complaint of left chest pain. Started around midnight and has been persistent in nature. States that she has been dealing with this for a few months with biweekly events but tonight hurt worse than prior. HX of chronic afib and compliant on all medications including AC Denies [id number]   Patient's recorded medical, surgical, social, medication list and allergies were reviewed in the Snapshot window as part of the initial history.   Review of Systems   Review of Systems  Constitutional:  Negative for chills and fever.  HENT:  Negative for ear pain and sore throat.   Eyes:  Negative for pain and visual disturbance.  Respiratory:  Negative for cough and shortness of breath.   Cardiovascular:  Positive for chest pain. Negative for palpitations.  Gastrointestinal:  Negative for abdominal pain and vomiting.  Genitourinary:  Negative for dysuria and hematuria.  Musculoskeletal:  Negative for arthralgias and back pain.  Skin:  Negative for color change and rash.  Neurological:  Negative for seizures and syncope.  All other systems reviewed and are negative.   Physical Exam Updated Vital Signs BP (!) 186/91 (BP Location: Right Arm)   Pulse 72   Temp 97.8 F (36.6 C)   Resp 14   Ht 4' 11 (1.499 m)   Wt 75.8 kg   SpO2 100%   BMI 33.73 kg/m  Physical Exam Vitals and nursing note reviewed.  Constitutional:      General: She is not in acute distress.    Appearance: She is well-developed.  HENT:     Head: Normocephalic and atraumatic.  Eyes:     Conjunctiva/sclera: Conjunctivae normal.  Cardiovascular:     Rate and Rhythm: Normal rate and regular rhythm.     Heart sounds: No murmur heard. Pulmonary:     Effort: Pulmonary  effort is normal. No respiratory distress.     Breath sounds: Normal breath sounds.  Abdominal:     General: There is no distension.     Palpations: Abdomen is soft.     Tenderness: There is no abdominal tenderness. There is no right CVA tenderness or left CVA tenderness.  Musculoskeletal:        General: No swelling or tenderness. Normal range of motion.     Cervical back: Neck supple.  Skin:    General: Skin is warm and dry.  Neurological:     General: No focal deficit present.     Mental Status: She is alert and oriented to person, place, and time. Mental status is at baseline.     Cranial Nerves: No cranial nerve deficit.      ED Course/ Medical Decision Making/ A&P    Procedures Procedures   Medications Ordered in ED Medications  acetaminophen  (TYLENOL ) tablet 1,000 mg (1,000 mg Oral Given 08/28/24 0319)  famotidine (PEPCID) tablet 20 mg (20 mg Oral Given 08/28/24 0320)   Medical Decision Making: Kathleen Moreno is a 63 y.o. female who presented to the ED today with chest pain, detailed above.     Patient placed on continuous vitals and telemetry monitoring while in ED which was reviewed periodically.  Complete initial physical exam performed, notably the patient was hemodynamically stable in no acute distress.  Reviewed and confirmed nursing documentation for past medical history, family history, social history.    Initial Assessment: With the patient's presentation of left-sided chest pain, most likely diagnosis is musculoskeletal chest pain versus GERD, although ACS remains on the differential. Other diagnoses were considered including (but not limited to) pulmonary embolism, community-acquired pneumonia, aortic dissection, pneumothorax, underlying bony abnormality, anemia. These are considered less likely due to history of present illness and physical exam findings.    In particular, concerning pulmonary embolism: they deny malignancy, recent surgery, history of DVT, or  calf tenderness leading to a low risk Wells score. Aortic Dissection also reconsidered but seems less likely based on the location, quality, onset, and severity of symptoms in this case. Patient has a lack of serious comorbidities for this condition including a lack of HTN or Smoking. Patient also has a lack of underlying history of AD or TAA.  This is most consistent with an acute life/limb threatening illness complicated by underlying chronic conditions.   Initial Plan: EKG and single troponin to evaluate for cardiac pathology. Single troponin appropriate due to greater than 6 hours since maximal intensity of symptoms. Evaluate for dissection, bony abnormality, or pneumonia with chest x-ray and screening laboratory evaluation including CBC, BMP  Further evaluation for pulmonary embolism not indicated at this time based on patient's symptoms and Wells score.  Further evaluation for Thoracic Aortic Dissection not indicated at this time based on patient's clinical history and PE findings.   Initial Study Results: EKG was reviewed independently. Rate, rhythm, axis, intervals all examined and without medically relevant abnormality. ST segments without concerns for elevations.    Laboratory  Single troponin demonstrated no acute pathology  CBC and BMP without obvious metabolic or inflammatory abnormalities requiring further evaluation   Radiology  DG Chest Portable 1 View Result Date: 08/28/2024 CLINICAL DATA:  Chest pain EXAM: PORTABLE CHEST 1 VIEW COMPARISON:  09/22/2023 FINDINGS: The heart size and mediastinal contours are within normal limits. Both lungs are clear. The visualized skeletal structures are unremarkable. IMPRESSION: No active disease. Electronically Signed   By: Oneil Devonshire M.D.   On: 08/28/2024 03:07    Final Assessment and Plan: On reassessment, patient is ambulatory no acute distress, chest pain completely resolved.  Uncertain of musculoskeletal, gastroesophageal in nature  but seems very unlikely to be cardiac in nature given description of pain.  Resolved with Tylenol  and Pepcid.  Troponin undetectable.  Referred patient back to cardiology for ongoing care and management the outpatient setting with strict return precautions regarding recurrence of symptoms, syncope, new change in her presentation or addition of dyspnea/other symptoms and patient expressed understanding feeling comfortable discharge at this time.            Clinical Impression:  1. Chest pain, unspecified type      Discharge   Final Clinical Impression(s) / ED Diagnoses Final diagnoses:  Chest pain, unspecified type    Rx / DC Orders ED Discharge Orders          Ordered    pantoprazole (PROTONIX) 20 MG tablet  Daily        08/28/24 0447    Ambulatory referral to Cardiology        08/28/24 0447              Jerral Meth, MD 08/28/24 [phone]
